# Patient Record
Sex: Female | Born: 1985 | Race: White | Hispanic: No | Marital: Single | Smoking: Current every day smoker
Health system: Southern US, Community
[De-identification: ages and names within clinical notes are randomized; demographics above are authoritative.]

## PROBLEM LIST (undated history)

## (undated) DIAGNOSIS — J984 Other disorders of lung: Secondary | ICD-10-CM

## (undated) DIAGNOSIS — J45909 Unspecified asthma, uncomplicated: Secondary | ICD-10-CM

## (undated) DIAGNOSIS — I319 Disease of pericardium, unspecified: Secondary | ICD-10-CM

## (undated) DIAGNOSIS — I2699 Other pulmonary embolism without acute cor pulmonale: Secondary | ICD-10-CM

## (undated) HISTORY — PX: TONSILLECTOMY: SUR1361

## (undated) HISTORY — DX: Disease of pericardium, unspecified: I31.9

## (undated) HISTORY — PX: OTHER SURGICAL HISTORY: SHX169

---

## 2009-10-28 ENCOUNTER — Ambulatory Visit (HOSPITAL_COMMUNITY): Admission: RE | Admit: 2009-10-28 | Discharge: 2009-10-28 | Payer: Self-pay | Admitting: Obstetrics and Gynecology

## 2013-06-06 ENCOUNTER — Other Ambulatory Visit (HOSPITAL_COMMUNITY)
Admission: RE | Admit: 2013-06-06 | Discharge: 2013-06-06 | Disposition: A | Discharge status: Home / Self Care | Payer: 59 | Source: Ambulatory Visit | Attending: Emergency Medicine | Admitting: Emergency Medicine

## 2013-06-06 ENCOUNTER — Emergency Department (HOSPITAL_COMMUNITY)
Admission: EM | Admit: 2013-06-06 | Discharge: 2013-06-06 | Disposition: A | Discharge status: Hospice | Payer: 59 | Source: Home / Self Care | Attending: Emergency Medicine | Admitting: Emergency Medicine

## 2013-06-06 ENCOUNTER — Emergency Department (HOSPITAL_COMMUNITY): Payer: 59

## 2013-06-06 ENCOUNTER — Encounter (HOSPITAL_COMMUNITY): Payer: Self-pay | Admitting: Emergency Medicine

## 2013-06-06 ENCOUNTER — Emergency Department (HOSPITAL_COMMUNITY)
Admission: EM | Admit: 2013-06-06 | Discharge: 2013-06-06 | Disposition: A | Discharge status: Home / Self Care | Payer: 59 | Attending: Emergency Medicine | Admitting: Emergency Medicine

## 2013-06-06 ENCOUNTER — Emergency Department (INDEPENDENT_AMBULATORY_CARE_PROVIDER_SITE_OTHER): Payer: 59

## 2013-06-06 DIAGNOSIS — Z791 Long term (current) use of non-steroidal anti-inflammatories (NSAID): Secondary | ICD-10-CM | POA: Insufficient documentation

## 2013-06-06 DIAGNOSIS — Z79899 Other long term (current) drug therapy: Secondary | ICD-10-CM | POA: Insufficient documentation

## 2013-06-06 DIAGNOSIS — R109 Unspecified abdominal pain: Secondary | ICD-10-CM | POA: Insufficient documentation

## 2013-06-06 DIAGNOSIS — E860 Dehydration: Secondary | ICD-10-CM

## 2013-06-06 DIAGNOSIS — B9789 Other viral agents as the cause of diseases classified elsewhere: Secondary | ICD-10-CM

## 2013-06-06 DIAGNOSIS — R0789 Other chest pain: Secondary | ICD-10-CM

## 2013-06-06 DIAGNOSIS — J45901 Unspecified asthma with (acute) exacerbation: Secondary | ICD-10-CM | POA: Insufficient documentation

## 2013-06-06 DIAGNOSIS — Z880 Allergy status to penicillin: Secondary | ICD-10-CM | POA: Insufficient documentation

## 2013-06-06 DIAGNOSIS — R071 Chest pain on breathing: Secondary | ICD-10-CM | POA: Insufficient documentation

## 2013-06-06 DIAGNOSIS — Z113 Encounter for screening for infections with a predominantly sexual mode of transmission: Secondary | ICD-10-CM | POA: Insufficient documentation

## 2013-06-06 DIAGNOSIS — Z792 Long term (current) use of antibiotics: Secondary | ICD-10-CM | POA: Insufficient documentation

## 2013-06-06 DIAGNOSIS — Z86711 Personal history of pulmonary embolism: Secondary | ICD-10-CM | POA: Insufficient documentation

## 2013-06-06 DIAGNOSIS — B349 Viral infection, unspecified: Secondary | ICD-10-CM

## 2013-06-06 DIAGNOSIS — N760 Acute vaginitis: Secondary | ICD-10-CM | POA: Insufficient documentation

## 2013-06-06 DIAGNOSIS — Z9089 Acquired absence of other organs: Secondary | ICD-10-CM | POA: Insufficient documentation

## 2013-06-06 HISTORY — DX: Other disorders of lung: J98.4

## 2013-06-06 HISTORY — DX: Other pulmonary embolism without acute cor pulmonale: I26.99

## 2013-06-06 HISTORY — DX: Unspecified asthma, uncomplicated: J45.909

## 2013-06-06 LAB — POCT URINALYSIS DIP (DEVICE)
Bilirubin Urine: NEGATIVE
Glucose, UA: NEGATIVE mg/dL
Ketones, ur: NEGATIVE mg/dL
Leukocytes, UA: NEGATIVE
Nitrite: NEGATIVE
PH: 7 (ref 5.0–8.0)
Protein, ur: NEGATIVE mg/dL
Specific Gravity, Urine: 1.025 (ref 1.005–1.030)
UROBILINOGEN UA: 0.2 mg/dL (ref 0.0–1.0)

## 2013-06-06 LAB — POCT I-STAT, CHEM 8
BUN: 6 mg/dL (ref 6–23)
CREATININE: 0.7 mg/dL (ref 0.50–1.10)
Calcium, Ion: 1.22 mmol/L (ref 1.12–1.23)
Chloride: 104 mEq/L (ref 96–112)
GLUCOSE: 93 mg/dL (ref 70–99)
HEMATOCRIT: 48 % — AB (ref 36.0–46.0)
HEMOGLOBIN: 16.3 g/dL — AB (ref 12.0–15.0)
Potassium: 3.6 mEq/L — ABNORMAL LOW (ref 3.7–5.3)
Sodium: 140 mEq/L (ref 137–147)
TCO2: 24 mmol/L (ref 0–100)

## 2013-06-06 LAB — COMPREHENSIVE METABOLIC PANEL
ALT: 11 U/L (ref 0–35)
AST: 11 U/L (ref 0–37)
Albumin: 4.3 g/dL (ref 3.5–5.2)
Alkaline Phosphatase: 60 U/L (ref 39–117)
BUN: 6 mg/dL (ref 6–23)
CO2: 22 mEq/L (ref 19–32)
CREATININE: 0.65 mg/dL (ref 0.50–1.10)
Calcium: 8.9 mg/dL (ref 8.4–10.5)
Chloride: 101 mEq/L (ref 96–112)
GFR calc non Af Amer: >90 mL/min (ref >90)
Glucose, Bld: 82 mg/dL (ref 70–99)
Potassium: 3.6 mEq/L — ABNORMAL LOW (ref 3.7–5.3)
SODIUM: 139 meq/L (ref 137–147)
TOTAL PROTEIN: 7.5 g/dL (ref 6.0–8.3)
Total Bilirubin: 0.4 mg/dL (ref 0.3–1.2)

## 2013-06-06 LAB — CBC WITH DIFFERENTIAL/PLATELET
Basophils Absolute: 0 10*3/uL (ref 0.0–0.1)
Basophils Absolute: 0 10*3/uL (ref 0.0–0.1)
Basophils Relative: 0 % (ref 0–1)
Basophils Relative: 0 % (ref 0–1)
EOS ABS: 0.1 10*3/uL (ref 0.0–0.7)
EOS ABS: 0.1 10*3/uL (ref 0.0–0.7)
EOS PCT: 1 % (ref 0–5)
Eosinophils Relative: 1 % (ref 0–5)
HCT: 43.2 % (ref 36.0–46.0)
HCT: 40.2 % (ref 36.0–46.0)
Hemoglobin: 15.2 g/dL — ABNORMAL HIGH (ref 12.0–15.0)
Hemoglobin: 13.9 g/dL (ref 12.0–15.0)
Lymphocytes Relative: 44 % (ref 12–46)
Lymphocytes Relative: 42 % (ref 12–46)
Lymphs Abs: 4 10*3/uL (ref 0.7–4.0)
Lymphs Abs: 3.9 10*3/uL (ref 0.7–4.0)
MCH: 30.3 pg (ref 26.0–34.0)
MCH: 29.6 pg (ref 26.0–34.0)
MCHC: 35.2 g/dL (ref 30.0–36.0)
MCHC: 34.6 g/dL (ref 30.0–36.0)
MCV: 86.1 fL (ref 78.0–100.0)
MCV: 85.7 fL (ref 78.0–100.0)
MONO ABS: 0.6 10*3/uL (ref 0.1–1.0)
MONO ABS: 0.6 10*3/uL (ref 0.1–1.0)
Monocytes Relative: 6 % (ref 3–12)
Monocytes Relative: 6 % (ref 3–12)
NEUTROS PCT: 49 % (ref 43–77)
Neutro Abs: 4.6 10*3/uL (ref 1.7–7.7)
Neutro Abs: 4.7 10*3/uL (ref 1.7–7.7)
Neutrophils Relative %: 51 % (ref 43–77)
PLATELETS: 202 10*3/uL (ref 150–400)
Platelets: 215 10*3/uL (ref 150–400)
RBC: 5.02 MIL/uL (ref 3.87–5.11)
RBC: 4.69 MIL/uL (ref 3.87–5.11)
RDW: 12.9 % (ref 11.5–15.5)
RDW: 12.8 % (ref 11.5–15.5)
WBC: 9.3 10*3/uL (ref 4.0–10.5)
WBC: 9.2 10*3/uL (ref 4.0–10.5)

## 2013-06-06 LAB — RAPID STREP SCREEN (MED CTR MEBANE ONLY): Streptococcus, Group A Screen (Direct): NEGATIVE

## 2013-06-06 LAB — TROPONIN I: Troponin I: <0.3 ng/mL (ref <0.30)

## 2013-06-06 LAB — D-DIMER, QUANTITATIVE (NOT AT ARMC)

## 2013-06-06 LAB — POCT PREGNANCY, URINE: Preg Test, Ur: NEGATIVE

## 2013-06-06 LAB — LIPASE, BLOOD: Lipase: 14 U/L (ref 11–59)

## 2013-06-06 MED ORDER — ONDANSETRON HCL 4 MG/2ML IJ SOLN
4.0000 mg | Freq: Once | INTRAMUSCULAR | Status: AC
  Administered 2013-06-06: 4 mg via INTRAVENOUS
  Filled 2013-06-06: qty 2

## 2013-06-06 MED ORDER — SODIUM CHLORIDE 0.9 % IV BOLUS (SEPSIS)
1000.0000 mL | Freq: Once | INTRAVENOUS | Status: AC
  Administered 2013-06-06: 1000 mL via INTRAVENOUS

## 2013-06-06 MED ORDER — HYDROMORPHONE HCL PF 1 MG/ML IJ SOLN
INTRAMUSCULAR | Status: AC
  Filled 2013-06-06: qty 1

## 2013-06-06 MED ORDER — HYDROMORPHONE HCL PF 1 MG/ML IJ SOLN
1.0000 mg | Freq: Once | INTRAMUSCULAR | Status: AC
  Administered 2013-06-06: 1 mg via INTRAVENOUS

## 2013-06-06 MED ORDER — ONDANSETRON HCL 4 MG/2ML IJ SOLN
4.0000 mg | Freq: Once | INTRAMUSCULAR | Status: AC
  Administered 2013-06-06: 4 mg via INTRAVENOUS

## 2013-06-06 MED ORDER — MORPHINE SULFATE 4 MG/ML IJ SOLN
4.0000 mg | Freq: Once | INTRAMUSCULAR | Status: AC
  Administered 2013-06-06: 4 mg via INTRAVENOUS
  Filled 2013-06-06: qty 1

## 2013-06-06 MED ORDER — SODIUM CHLORIDE 0.9 % IV SOLN
INTRAVENOUS | Status: DC
  Administered 2013-06-06: 11:00:00 via INTRAVENOUS

## 2013-06-06 MED ORDER — IBUPROFEN 800 MG PO TABS
800.0000 mg | ORAL_TABLET | Freq: Three times a day (TID) | ORAL | Status: AC
Start: 2013-06-06 — End: ?

## 2013-06-06 MED ORDER — IOHEXOL 350 MG/ML SOLN
100.0000 mL | Freq: Once | INTRAVENOUS | Status: AC | PRN
  Administered 2013-06-06: 100 mL via INTRAVENOUS

## 2013-06-06 MED ORDER — ONDANSETRON HCL 4 MG/2ML IJ SOLN
INTRAMUSCULAR | Status: AC
  Filled 2013-06-06: qty 2

## 2013-06-06 MED ORDER — ONDANSETRON HCL 4 MG/2ML IJ SOLN: 4.0000 mg | Freq: Once | INTRAMUSCULAR | Status: DC

## 2013-06-06 MED ORDER — KETOROLAC TROMETHAMINE 30 MG/ML IJ SOLN
30.0000 mg | Freq: Once | INTRAMUSCULAR | Status: AC
  Administered 2013-06-06: 30 mg via INTRAVENOUS
  Filled 2013-06-06: qty 1

## 2013-06-06 MED ORDER — IOHEXOL 300 MG/ML  SOLN
25.0000 mL | INTRAMUSCULAR | Status: AC
  Administered 2013-06-06: 25 mL via ORAL

## 2013-06-06 MED ORDER — ONDANSETRON HCL 4 MG PO TABS: 4.0000 mg | ORAL_TABLET | Freq: Four times a day (QID) | ORAL | Status: DC

## 2013-06-06 MED ORDER — HYDROMORPHONE HCL PF 1 MG/ML IJ SOLN: 1.0000 mg | Freq: Once | INTRAMUSCULAR | Status: DC

## 2013-06-06 NOTE — ED Notes (Signed)
Md Rancour at bedside.  

## 2013-06-06 NOTE — ED Provider Notes (Signed)
CSN: [id number]     Arrival date & time 06/06/13  1513 History   First MD Initiated Contact with Patient 06/06/13 1545     Chief Complaint  Patient presents with  . Chest Pain  . Emesis     (Consider location/radiation/quality/duration/timing/severity/associated sxs/prior Treatment) HPI Comments: Patient from urgent care with a 5 day history of diarrhea, nausea and vomiting. She also endorses crampy lower abdominal pain with fever up to 100.4. Chills and sweats. She was treated for viral syndrome at urgent care with IV fluids. She had a negative urinalysis, negative pregnancy test. Electrolytes were normal. Chest x-ray was negative. D-dimer was negative. Patient developed central chest pain around 1 AM it has been constant and associated with deep breathing. 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 She states this pain feels different. She denies contacts. She has been on azithromycin for the past 4 days which was started prior to upper respiratory infection. This illness started before she was on azithromycin. She is given IV fluids and sent to the ED when she was still having chest pain is still orthostatic.  The history is provided by the patient.    Past Medical History  Diagnosis Date  . Lung disease, restrictive   . PE (pulmonary embolism)   . Asthma    Past Surgical History  Procedure Laterality Date  . Tonsillectomy     Family History  Problem Relation Age of Onset  . COPD Father    History  Substance Use Topics  . Smoking status: Not on file  . Smokeless tobacco: Not on file  . Alcohol Use: Not on file   OB History   Grav Para Term Preterm Abortions TAB SAB Ect Mult Living                 Review of Systems  Constitutional: Positive for fever, activity change, appetite change and fatigue.  HENT: Positive for sore throat. Negative for congestion.   Respiratory: Positive for chest  tightness and shortness of breath. Negative for cough.   Cardiovascular: Positive for chest pain.  Gastrointestinal: Positive for nausea, vomiting, abdominal pain and diarrhea.  Genitourinary: Negative for dysuria and hematuria.  Musculoskeletal: Positive for arthralgias and myalgias.  Neurological: Positive for weakness.  A complete 10 system review of systems was obtained and all systems are negative except as noted in the HPI and PMH.      Allergies  Penicillins and Sulfa antibiotics  Home Medications   Current Outpatient Rx  Name  Route  Sig  Dispense  Refill  . acetaminophen (TYLENOL) 500 MG tablet   Oral   Take 1,000 mg by mouth every 8 (eight) hours as needed for fever.         . Aromatic Inhalants (DECONGESTANT INHALER IN)   Inhalation   Inhale 1 puff into the lungs 5 (five) times daily as needed (allergies).         Marland Kitchen azithromycin (ZITHROMAX) 250 MG tablet   Oral   Take 250 mg by mouth daily.         Marland Kitchen LORazepam (ATIVAN) 1 MG tablet   Oral   Take 1 mg by mouth every 8 (eight) hours as needed for anxiety.         Marland Kitchen PAROXETINE HCL PO   Oral   Take 1 tablet by mouth daily.         Marland Kitchen ibuprofen (ADVIL,MOTRIN) 800  MG tablet   Oral   Take 1 tablet (800 mg total) by mouth 3 (three) times daily.   21 tablet   0   . ondansetron (ZOFRAN) 4 MG tablet   Oral   Take 1 tablet (4 mg total) by mouth every 6 (six) hours.   12 tablet   0    BP 108/55  Pulse 76  Temp(Src) 98.1 F (36.7 C) (Oral)  Resp 14  SpO2 100%  LMP 04/08/2013 Physical Exam  Constitutional: She is oriented to person, place, and time. She appears well-developed and well-nourished. No distress.  HENT:  Head: Normocephalic and atraumatic.  Mouth/Throat: Oropharynx is clear and moist. No oropharyngeal exudate.  Eyes: Conjunctivae and EOM are normal. Pupils are equal, round, and reactive to light.  Neck: Normal range of motion. Neck supple.  No meningismus  Cardiovascular: Normal rate,  regular rhythm and normal heart sounds.   No murmur heard. Pulmonary/Chest: Effort normal and breath sounds normal. No respiratory distress. She exhibits tenderness.  Chest wall deformity  Abdominal: Soft. There is no tenderness. There is no rebound and no guarding.  Soft, NTND  Musculoskeletal: Normal range of motion. She exhibits no edema and no tenderness.  Neurological: She is alert and oriented to person, place, and time. No cranial nerve deficit. She exhibits normal muscle tone. Coordination normal.  Skin: Skin is warm. No rash noted.    ED Course  Procedures (including critical care time) Labs Review Labs Reviewed  COMPREHENSIVE METABOLIC PANEL - Abnormal; Notable for the following:    Potassium 3.6 (*)    All other components within normal limits  RAPID STREP SCREEN  CULTURE, GROUP A STREP  CBC WITH DIFFERENTIAL  LIPASE, BLOOD  TROPONIN I   Imaging Review Dg Chest 2 View  06/06/2013   CLINICAL DATA:  Nausea, cough, vomiting  EXAM: CHEST  2 VIEW  COMPARISON:  11/04/2012  FINDINGS: Cardiomediastinal silhouette is stable. No acute infiltrate or pleural effusion. No pulmonary edema. Bony thorax is unremarkable.  IMPRESSION: No active cardiopulmonary disease.   Electronically Signed   By: Natasha MeadLiviu  Pop M.D.   On: 06/06/2013 11:46   Ct Angio Chest Pe W/cm &/or Wo Cm  06/06/2013   CLINICAL DATA:  Chest pain and shortness of Breath.  Nausea and vomiting.  EXAM: CT ANGIOGRAPHY CHEST  CT ABDOMEN AND PELVIS WITH CONTRAST  TECHNIQUE: Multidetector CT imaging of the chest was performed using the standard protocol during bolus administration of intravenous contrast. Multiplanar CT image reconstructions and MIPs were obtained to evaluate the vascular anatomy. Multidetector CT imaging of the abdomen and pelvis was performed using the standard protocol during bolus administration of intravenous contrast.  CONTRAST:  100mL OMNIPAQUE IOHEXOL 350 MG/ML SOLN  COMPARISON:  10/14/2011  FINDINGS: CTA CHEST  FINDINGS  The chest wall is unremarkable. No breast masses, supraclavicular or axillary adenopathy. The bony thorax is intact. Pectus carinatum noted. The thyroid gland appears normal.  The heart is normal in size. No pericardial effusion. No mediastinal or hilar mass or adenopathy. The aorta is normal in caliber. No dissection. The esophagus is grossly normal.  The pulmonary arterial tree is well opacified. No filling defects to suggest pulmonary emboli.  The lungs are clear.  No pleural effusion or pulmonary nodule.  CT ABDOMEN and PELVIS FINDINGS  The liver is unremarkable. No focal hepatic lesions or biliary dilatation. The gallbladder is normal. No common bowel duct dilatation. The pancreas is normal. The spleen is normal. The adrenal glands and kidneys  are normal.  The stomach, duodenum, small bowel and colon are unremarkable. No inflammatory changes, mass lesions or obstructive findings. The appendix is normal. No mesenteric or retroperitoneal mass or adenopathy. The aorta and branch vessels are normal. The major venous structures are patent.  The uterus and ovaries are unremarkable. Small cyst associated with both ovaries. No pelvic mass, adenopathy or significant free pelvic fluid collection. No inguinal mass or adenopathy.  The bony structures are unremarkable.  Review of the MIP images confirms the above findings.  IMPRESSION: 1. No CT findings for pulmonary embolism. 2. Normal thoracic aorta. 3. No acute pulmonary findings. 4. No acute abdominal/pelvic findings, mass lesions or adenopathy.   Electronically Signed   By: Loralie Champagne M.D.   On: 06/06/2013 18:13   Ct Abdomen Pelvis W Contrast  06/06/2013   CLINICAL DATA:  Chest pain and shortness of Breath.  Nausea and vomiting.  EXAM: CT ANGIOGRAPHY CHEST  CT ABDOMEN AND PELVIS WITH CONTRAST  TECHNIQUE: Multidetector CT imaging of the chest was performed using the standard protocol during bolus administration of intravenous contrast. Multiplanar CT  image reconstructions and MIPs were obtained to evaluate the vascular anatomy. Multidetector CT imaging of the abdomen and pelvis was performed using the standard protocol during bolus administration of intravenous contrast.  CONTRAST:  OMNIPAQUE IOHEXOL 350 MG/ML SOLN  COMPARISON:  10/14/2011  FINDINGS: CTA CHEST FINDINGS  The chest wall is unremarkable. No breast masses, supraclavicular or axillary adenopathy. The bony thorax is intact. Pectus carinatum noted. The thyroid gland appears normal.  The heart is normal in size. No pericardial effusion. No mediastinal or hilar mass or adenopathy. The aorta is normal in caliber. No dissection. The esophagus is grossly normal.  The pulmonary arterial tree is well opacified. No filling defects to suggest pulmonary emboli.  The lungs are clear.  No pleural effusion or pulmonary nodule.  CT ABDOMEN and PELVIS FINDINGS  The liver is unremarkable. No focal hepatic lesions or biliary dilatation. The gallbladder is normal. No common bowel duct dilatation. The pancreas is normal. The spleen is normal. The adrenal glands and kidneys are normal.  The stomach, duodenum, small bowel and colon are unremarkable. No inflammatory changes, mass lesions or obstructive findings. The appendix is normal. No mesenteric or retroperitoneal mass or adenopathy. The aorta and branch vessels are normal. The major venous structures are patent.  The uterus and ovaries are unremarkable. Small cyst associated with both ovaries. No pelvic mass, adenopathy or significant free pelvic fluid collection. No inguinal mass or adenopathy.  The bony structures are unremarkable.  Review of the MIP images confirms the above findings.  IMPRESSION: 1. No CT findings for pulmonary embolism. 2. Normal thoracic aorta. 3. No acute pulmonary findings. 4. No acute abdominal/pelvic findings, mass lesions or adenopathy.   Electronically Signed   By: Loralie Champagne M.D.   On: 06/06/2013 18:13     EKG  Interpretation   Date/Time:  Thursday June 06 2013 15:24:31 EDT Ventricular Rate:  78 PR Interval:  117 QRS Duration: 89 QT Interval:  390 QTC Calculation: 444 R Axis:   66 Text Interpretation:  Sinus or ectopic atrial rhythm Borderline short PR  interval Borderline T abnormalities, inferior leads No significant change  was found Confirmed by Manus Gunning  MD, Ebonie Westerlund [phone]) on 06/06/2013 3:46:02 PM      MDM   Final diagnoses:  Viral syndrome  Dehydration  Chest wall pain   Patient from urgent care with 5 day history of diarrhea, vomiting,  body aches, cramps, cough and chest pain has been constant since 1 AM. Her chest is sore to palpation. EKG shows normal sinus rhythm. History of PE and pericarditis.  UA negative at urgent care. Pregnancy test negative. Chest x-ray negative. Patient is in no distress. She is reproducible chest tenderness. Her d-dimer was negative at urgent care.  Labs remarkable for decreased hemoglobin for likely dilutional due to all the fluid she received today.  CT is negative for PE. No intra-abdominal pathology. Patient given IV fluids in the ED. She's not had any vomiting and she is tolerating by mouth. She is able to ambulate.  Suspect viral syndrome causing nausea, vomiting and diarrhea. No peritoneal signs. Chest wall is tender and reproducible to palpation. No evidence of MI, PE, pneumonia or pneumothorax. We'll treat with supportive fluids at home, Zofran, anti-inflammatories for chest wall pain. BP 108/55  Pulse 76  Temp(Src) 98.1 F (36.7 C) (Oral)  Resp 14  SpO2 100%  LMP 04/08/2013   Glynn Octave, MD 06/07/13 0000

## 2013-06-06 NOTE — ED Notes (Signed)
Patient transported to X-ray 

## 2013-06-06 NOTE — Discharge Instructions (Signed)
We have determined that your problem requires further evaluation in the emergency department.  We will take care of your transport there.  Once at the emergency department, you will be evaluated by a provider and they will order whatever treatment or tests they deem necessary.  We cannot guarantee that they will do any specific test or do any specific treatment.  ° °

## 2013-06-06 NOTE — ED Provider Notes (Signed)
Chief Complaint   Chief Complaint  Patient presents with  . Cough  . Emesis  . Diarrhea     History of Present Illness   Donna Knight is a 28 year old female who has had a five-day history that began with diarrhea with small streaks of bright red blood, nausea, and vomiting. She had some crampy lower abdominal pain, cough productive of light green sputum, chest pain which is worse with deep inspiration, and chest pressure. She's had trouble breathing and her chest feels tight. She feels weak and dizzy. She's had a low-grade temperature up to 100.4, chills, and sweats she also has a history of sore throat, rhinorrhea, headache, and neck stiffness. She also mentions some vaginal discharge, itching, and vaginal dryness. She has a history of 3 pulmonary emboli, but this happened while she was on birth control pills. She also has a history of 2 episodes of pericarditis.  Review of Systems   Other than as noted above, the patient denies any of the following symptoms: Systemic:  No fevers, chills, or dizziness. ENT:  No nasal congestion, rhinorrhea, or sore throat. Lungs:  No cough. GI:  Blood in stool or vomitus. GU:  No dysuria, frequency, or urgency.  PMFSH   Past medical history, family history, social history, meds, and allergies were reviewed.  She is allergic to sulfa and penicillin. She takes Ativan approximately. She was placed on azithromycin earlier in the week by her primary care physician, but she does not feel like she's getting any better, in fact she feels like she's getting worse.  Physical Exam     Vital signs:  BP 113/77  Pulse 102  Temp(Src) 98.6 F (37 C) (Oral)  Resp 18  SpO2 100% Filed Vitals:   06/06/13 1053 06/06/13 1155 Supine  06/06/13 1157 Sitting   BP: 130/84 123/71 113/77  Pulse: 82 88 102  Temp: 98.6 F (37 C)    TempSrc: Oral    Resp: 18    SpO2: 100%     General:  Alert and oriented.  In no distress.  Skin warm and dry.  Good skin turgor,  brisk capillary refill. ENT:  No scleral icterus, moist mucous membranes, no oral lesions, pharynx clear. Lungs:  Breath sounds clear and equal bilaterally.  No wheezes, rales, or rhonchi. Heart:  Rhythm regular, without extrasystoles.  No gallops or murmers. Abdomen:  Soft, flat, nondistended. No organomegaly or mass. Bowel sounds are hyperactive. She does have generalized, mild tenderness to palpation without guarding or rebound. Pelvic exam: Normal external genitalia. Vaginal and cervical mucosa were normal. There was no vaginal discharge or bleeding. No pain on cervical motion. Uterus was normal in size and shape and nontender. No adnexal tenderness or mass. Skin: Clear, warm, and dry.  Good turgor.  Brisk capillary refill.  Labs   Results for orders placed during the hospital encounter of 06/06/13  CBC WITH DIFFERENTIAL      Result Value Ref Range   WBC 9.3  4.0 - 10.5 K/uL   RBC 5.02  3.87 - 5.11 MIL/uL   Hemoglobin 15.2 (*) 12.0 - 15.0 g/dL   HCT 57.8  46.9 - 62.9 %   MCV 86.1  78.0 - 100.0 fL   MCH 30.3  26.0 - 34.0 pg   MCHC 35.2  30.0 - 36.0 g/dL   RDW 52.8  41.3 - 24.4 %   Platelets 215  150 - 400 K/uL   Neutrophils Relative % 49  43 - 77 %  Neutro Abs 4.6  1.7 - 7.7 K/uL   Lymphocytes Relative 44  12 - 46 %   Lymphs Abs 4.0  0.7 - 4.0 K/uL   Monocytes Relative 6  3 - 12 %   Monocytes Absolute 0.6  0.1 - 1.0 K/uL   Eosinophils Relative 1  0 - 5 %   Eosinophils Absolute 0.1  0.0 - 0.7 K/uL   Basophils Relative 0  0 - 1 %   Basophils Absolute 0.0  0.0 - 0.1 K/uL  D-DIMER, QUANTITATIVE      Result Value Ref Range   D-Dimer, Quant <0.27  0.00 - 0.48 ug/mL-FEU  POCT URINALYSIS DIP (DEVICE)      Result Value Ref Range   Glucose, UA NEGATIVE  NEGATIVE mg/dL   Bilirubin Urine NEGATIVE  NEGATIVE   Ketones, ur NEGATIVE  NEGATIVE mg/dL   Specific Gravity, Urine 1.025  1.005 - 1.030   Hgb urine dipstick TRACE (*) NEGATIVE   pH 7.0  5.0 - 8.0   Protein, ur NEGATIVE  NEGATIVE  mg/dL   Urobilinogen, UA 0.2  0.0 - 1.0 mg/dL   Nitrite NEGATIVE  NEGATIVE   Leukocytes, UA NEGATIVE  NEGATIVE  POCT PREGNANCY, URINE      Result Value Ref Range   Preg Test, Ur NEGATIVE  NEGATIVE  POCT I-STAT, CHEM 8      Result Value Ref Range   Sodium 140  137 - 147 mEq/L   Potassium 3.6 (*) 3.7 - 5.3 mEq/L   Chloride 104  96 - 112 mEq/L   BUN 6  6 - 23 mg/dL   Creatinine, Ser 2.45  0.50 - 1.10 mg/dL   Glucose, Bld 93  70 - 99 mg/dL   Calcium, Ion 8.09  9.83 - 1.23 mmol/L   TCO2 24  0 - 100 mmol/L   Hemoglobin 16.3 (*) 12.0 - 15.0 g/dL   HCT 38.2 (*) 50.5 - 39.7 %     Radiology   Dg Chest 2 View  06/06/2013   CLINICAL DATA:  Nausea, cough, vomiting  EXAM: CHEST  2 VIEW  COMPARISON:  11/04/2012  FINDINGS: Cardiomediastinal silhouette is stable. No acute infiltrate or pleural effusion. No pulmonary edema. Bony thorax is unremarkable.  IMPRESSION: No active cardiopulmonary disease.   Electronically Signed   By: Natasha Mead M.D.   On: 06/06/2013 11:46    I reviewed the images independently and personally and concur with the radiologist's findings.   EKG Results:  Date: 06/06/2013  Rate: 80  Rhythm: normal sinus rhythm  QRS Axis: normal  Intervals: PR prolonged  ST/T Wave abnormalities: normal  Conduction Disutrbances:none  Narrative Interpretation: Sinus rhythm with short PR interval  Old EKG Reviewed: none available   Course in Urgent Care Center   She was begun on IV normal saline. We'll plan to give her a liter, but the IV was very positional and we are only able to get in and half later. She was given Zofran 4 mg intravenously and Dilaudid a total of 2 mg intravenously. Following these treatments, she did not feel any better, in fact she stated she felt worse. She attempted to get off the exam table to pick up an emesis bag that she had dropped and was not able to get back up on the table. She was found sitting on the floor the exam room. The patient states she did not pass  out and did not hit her head or sustain any other injury.   Assessment  The primary encounter diagnosis was Viral syndrome. A diagnosis of Dehydration was also pertinent to this visit.  Plan   The patient was transferred to the ED via [facility] in stable condition.  Medical Decision Making:  28 year old female with history of pericarditis and PE times 3 has a 5 day history of diarrhea with small amounts of blood, vomiting of all PO intake, crampy abdominal pain, cough productive of yellow sputum, pleuritic chest pain, chest pressure, difficulty breathing, weakness, dizziness, low grade fever, chills, sweats, sore throat, rhinorrhea, neck stiffness, and headache.  Her exam is WNL.  She has had extensive workup here including CXR, EKG, CBC, iStat 8, d-dimer, UA, preg test, all of which have been negative.  Her blood pressures showed orthostatic drop.  She has been given 1 liter of IV fluid, Zofran IV, and Dilaudid 2 mg IV but does not feel any better, in fact, she feels worse.  My impression is viral syndrome with dehydration and I think she will need more extensive fluid replacement.           Reuben Likesavid C Krishauna Schatzman, MD 06/06/13 [phone]

## 2013-06-06 NOTE — ED Notes (Signed)
C/o diarrhea, cough which is making chest hurt, and vomiting States she hash sob and chest pain due to coughing Did see PCP on Tuesday and received medication but no relief.  Feels as if sx is worst Tylenol was taking today for fever pcp was called today and told patient that a cough syrup could be called into pharmacy

## 2013-06-06 NOTE — ED Notes (Signed)
PT [facility] with multiple complaints including diarrhea with small amts of blood, Vomitting, lower "crampy" abd pain, productive cough, CP/pressure, SOB, weakness,dizziness, low grade fever,, soret throat, rhinorrhea, neck stiffness and HA. Pt is AO x4. Neuro intact. NAD. Vitals stable on monitor.

## 2013-06-06 NOTE — ED Notes (Signed)
[facility] notified 

## 2013-06-06 NOTE — ED Notes (Signed)
[facility] with [facility]/V, productive cough, SOB since Sunday. [facility] central CP relieved with 2mg  Dialudid.130/82. 88 bpm. 18 RR. 100% RA. Hx: PE, pericarditis

## 2013-06-06 NOTE — Discharge Instructions (Signed)
Chest Wall Pain There is no evidence of heart attack or blood clot in the lung. Keep yourself hydrated. Follow up with your doctor. Return to the ED if you develop new or worsening symptoms. Chest wall pain is pain in or around the bones and muscles of your chest. It may take up to 6 weeks to get better. It may take longer if you must stay physically active in your work and activities.  CAUSES  Chest wall pain may happen on its own. However, it may be caused by:  A viral illness like the flu.  Injury.  Coughing.  Exercise.  Arthritis.  Fibromyalgia.  Shingles. HOME CARE INSTRUCTIONS   Avoid overtiring physical activity. Try not to strain or perform activities that cause pain. This includes any activities using your chest or your abdominal and side muscles, especially if heavy weights are used.  Put ice on the sore area.  Put ice in a plastic bag.  Place a towel between your skin and the bag.  Leave the ice on for 15-20 minutes per hour while awake for the first 2 days.  Only take over-the-counter or prescription medicines for pain, discomfort, or fever as directed by your caregiver. SEEK IMMEDIATE MEDICAL CARE IF:   Your pain increases, or you are very uncomfortable.  You have a fever.  Your chest pain becomes worse.  You have new, unexplained symptoms.  You have nausea or vomiting.  You feel sweaty or lightheaded.  You have a cough with phlegm (sputum), or you cough up blood. MAKE SURE YOU:   Understand these instructions.  Will watch your condition.  Will get help right away if you are not doing well or get worse. Document Released: 02/14/2005 Document Revised: 05/09/2011 Document Reviewed: 10/11/2010 [facility] Patient Information 2014 [employer] [place].  Dehydration, Adult Dehydration is when you lose more fluids from the body than you take in. Vital organs like the kidneys, brain, and heart cannot function without a proper amount of fluids and salt. Any loss  of fluids from the body can cause dehydration.  CAUSES   Vomiting.  Diarrhea.  Excessive sweating.  Excessive urine output.  Fever. SYMPTOMS  Mild dehydration  Thirst.  Dry lips.  Slightly dry mouth. Moderate dehydration  Very dry mouth.  Sunken eyes.  Skin does not bounce back quickly when lightly pinched and released.  Dark urine and decreased urine production.  Decreased tear production.  Headache. Severe dehydration  Very dry mouth.  Extreme thirst.  Rapid, weak pulse (more than 100 beats per minute at rest).  Cold hands and feet.  Not able to sweat in spite of heat and temperature.  Rapid breathing.  Blue lips.  Confusion and lethargy.  Difficulty being awakened.  Minimal urine production.  No tears. DIAGNOSIS  Your caregiver will diagnose dehydration based on your symptoms and your exam. Blood and urine tests will help confirm the diagnosis. The diagnostic evaluation should also identify the cause of dehydration. TREATMENT  Treatment of mild or moderate dehydration can often be done at home by increasing the amount of fluids that you drink. It is best to drink small amounts of fluid more often. Drinking too much at one time can make vomiting worse. Refer to the home care instructions below. Severe dehydration needs to be treated at the hospital where you will probably be given intravenous (IV) fluids that contain water and electrolytes. HOME CARE INSTRUCTIONS   Ask your caregiver about specific rehydration instructions.  Drink enough fluids to keep your  urine clear or pale yellow.  Drink small amounts frequently if you have nausea and vomiting.  Eat as you normally do.  Avoid:  Foods or drinks high in sugar.  Carbonated drinks.  Juice.  Extremely hot or cold fluids.  Drinks with caffeine.  Fatty, greasy foods.  Alcohol.  Tobacco.  Overeating.  Gelatin desserts.  Wash your hands well to avoid spreading bacteria and  viruses.  Only take over-the-counter or prescription medicines for pain, discomfort, or fever as directed by your caregiver.  Ask your caregiver if you should continue all prescribed and over-the-counter medicines.  Keep all follow-up appointments with your caregiver. SEEK MEDICAL CARE IF:  You have abdominal pain and it increases or stays in one area (localizes).  You have a rash, stiff neck, or severe headache.  You are irritable, sleepy, or difficult to awaken.  You are weak, dizzy, or extremely thirsty. SEEK IMMEDIATE MEDICAL CARE IF:   You are unable to keep fluids down or you get worse despite treatment.  You have frequent episodes of vomiting or diarrhea.  You have blood or green matter (bile) in your vomit.  You have blood in your stool or your stool looks black and tarry.  You have not urinated in 6 to 8 hours, or you have only urinated a small amount of very dark urine.  You have a fever.  You faint. MAKE SURE YOU:   Understand these instructions.  Will watch your condition.  Will get help right away if you are not doing well or get worse. Document Released: 02/14/2005 Document Revised: 05/09/2011 Document Reviewed: 10/04/2010 [facility] Patient Information 2014 [place], [place].

## 2013-06-06 NOTE — ED Notes (Signed)
PT was able to drink all fluid for CT with no emesis. Pt ambulates independently with no noted difficituly, denies dizziness or lightheadedness.

## 2013-06-07 LAB — CERVICOVAGINAL ANCILLARY ONLY
CHLAMYDIA, DNA PROBE: NEGATIVE
Neisseria Gonorrhea: NEGATIVE
WET PREP (BD AFFIRM): NEGATIVE
Wet Prep (BD Affirm): NEGATIVE
Wet Prep (BD Affirm): POSITIVE — AB

## 2013-06-08 LAB — CULTURE, GROUP A STREP

## 2013-06-10 ENCOUNTER — Telehealth (HOSPITAL_COMMUNITY): Payer: Self-pay | Admitting: Emergency Medicine

## 2013-06-10 MED ORDER — METRONIDAZOLE 500 MG PO TABS: 500.0000 mg | ORAL_TABLET | Freq: Two times a day (BID) | ORAL | Status: DC

## 2013-06-10 NOTE — Telephone Encounter (Signed)
Message copied by Reuben LikesKELLER, Indiah Heyden C on Mon Jun 10, 2013  4:33 PM ------      Message from: Vassie MoselleYORK, SUZANNE M      Created: Mon Jun 10, 2013  4:04 PM      Regarding: lab       Gardnerella pos. Rest of labs neg.  Pt. was transferred to ED, but I don't see that they treated this.      Desiree LucySuzanne M York      06/10/2013       ------

## 2013-06-10 NOTE — ED Notes (Signed)
The patient's DNA probe came back positive for Gardnerella. She will need metronidazole 500 mg, #14, 1 twice a day for one week. This will be sent to her pharmacy. We will need to call her and let her know these results.   Reuben Likesavid C Maveryck Bahri, MD 06/10/13 [phone]

## 2013-06-10 NOTE — ED Notes (Signed)
GC/Chlamydia neg., Affirm: Candida and Trich neg., Gardnerella pos.  Message sent to Dr. Lorenz CoasterKeller. Desiree LucySuzanne M [facility] 06/10/2013

## 2013-06-11 ENCOUNTER — Telehealth (HOSPITAL_COMMUNITY): Payer: Self-pay | Admitting: *Deleted

## 2013-06-11 NOTE — ED Notes (Signed)
I called pt. Pt. verified x 2 and given results.  Pt. told she needs Flagyl for bacterial vaginosis and where to pick up her Rx.  Pt. instructed to no alcohol while taking this medication.  Pt.'s questions about bacterial vaginosis answered. Desiree LucySuzanne M [facility] 06/11/2013

## 2014-08-22 IMAGING — CT CT ABD-PELV W/ CM
3 of 14 series · 12 of 46 positions shown, 18 images · IV contrast (omnipaque)
Comparison: 10/14/2011

CLINICAL DATA: Chest pain and shortness of Breath.

Nausea and vomiting.
EXAM:
CT ANGIOGRAPHY CHEST
CT ABDOMEN AND PELVIS WITH CONTRAST
TECHNIQUE: Multidetector CT imaging of the chest was performed using the
standard protocol during bolus administration of intravenous
contrast. Multiplanar CT image reconstructions and MIPs were
obtained to evaluate the vascular anatomy. Multidetector CT imaging
of the abdomen and pelvis was performed using the standard protocol
during bolus administration of intravenous contrast.
CONTRAST:  100mL OMNIPAQUE IOHEXOL 350 MG/ML SOLN

[Series 6: thins · axial · 0.59mm/px · z∈[-121,-25]mm · 5 of 193 slices shown]
[im 17/193  soft-tissue]
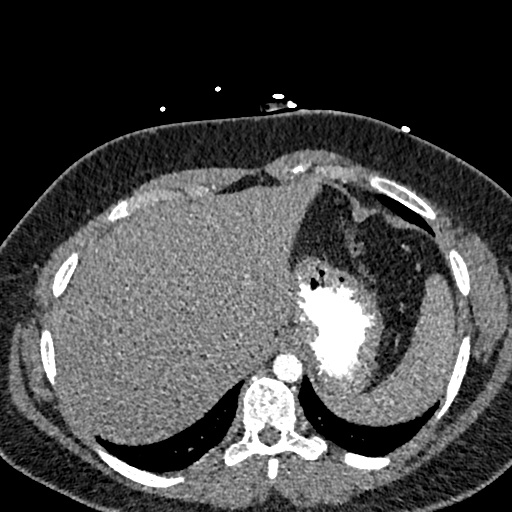
[im 49/193  soft-tissue]
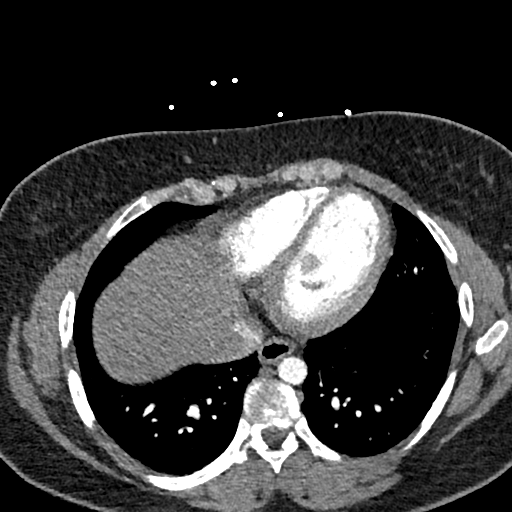
[im 65/193  soft-tissue]
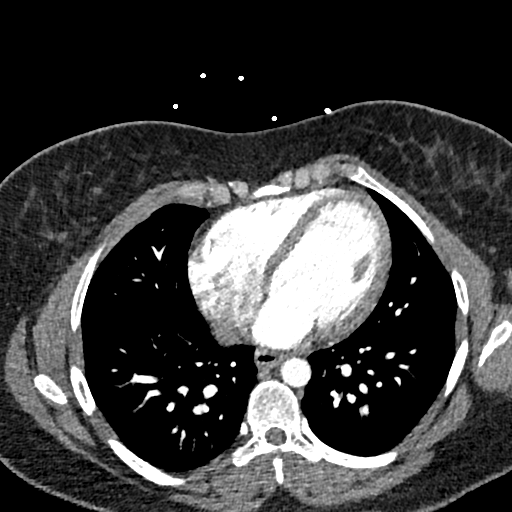
[im 81/193  soft-tissue]
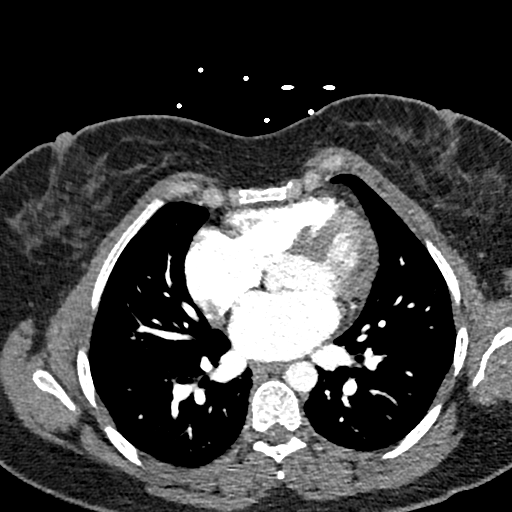
[im 113/193  soft-tissue]
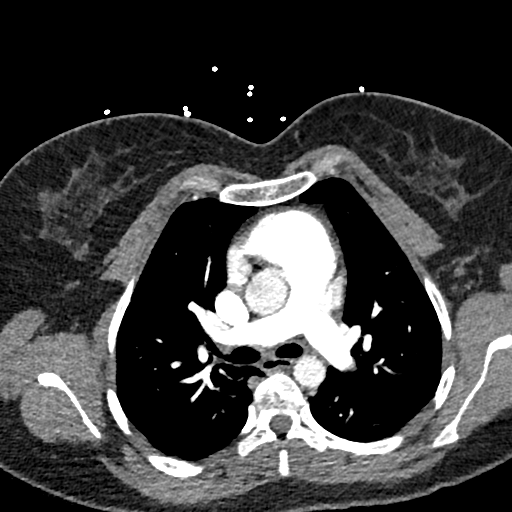

[Series 8: coronal mpr · coronal · 0.59mm/px · 2 of 109 slices shown, 3 images]
[im 37/109  soft-tissue]
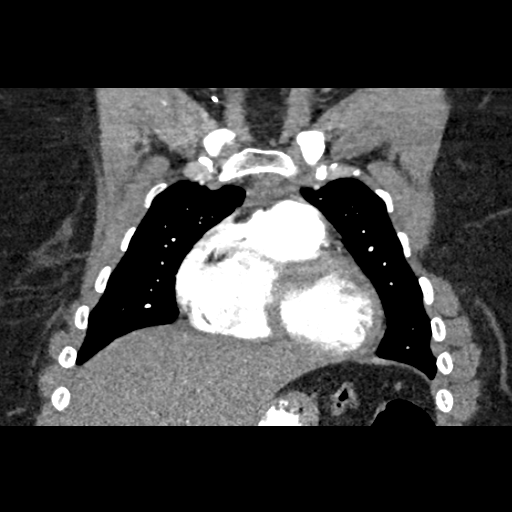
[im 37/109  bone]
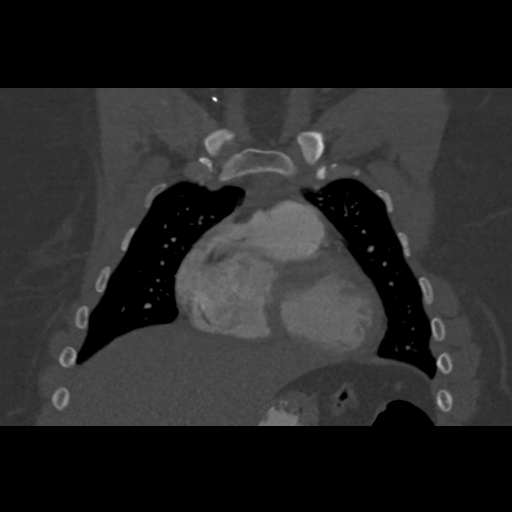
[im 73/109  soft-tissue]
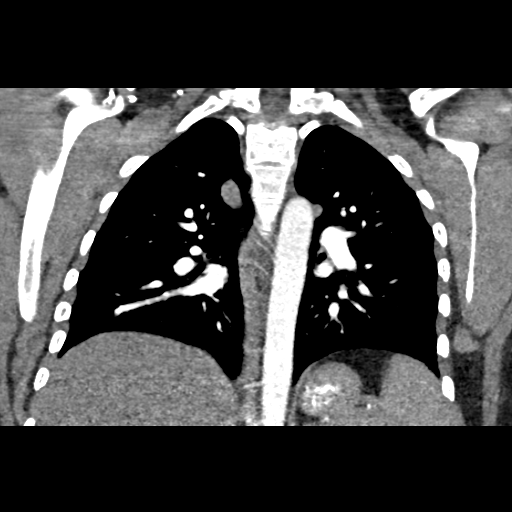

[Series 12: abd/ pelvis 5.0 i30f 1 · axial · 0.74mm/px · z∈[-466,-141]mm · 5 of 99 slices shown, 10 images]
[im 17/99  soft-tissue]
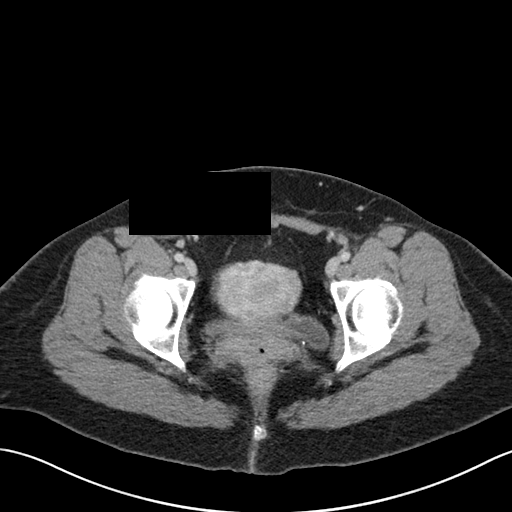
[im 17/99  bone]
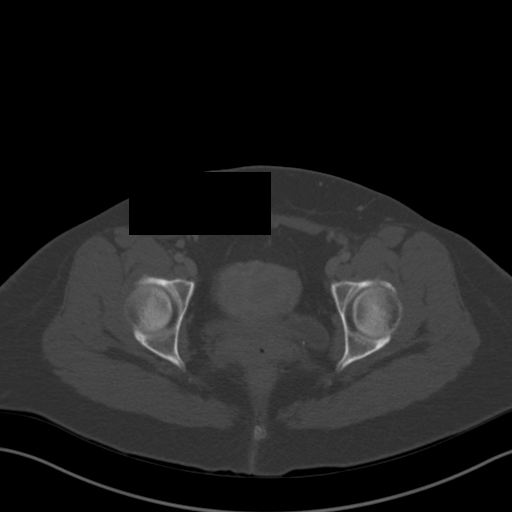
[im 33/99  soft-tissue]
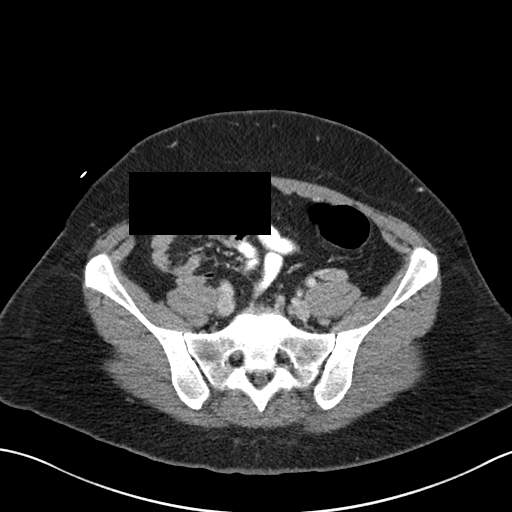
[im 33/99  lung]
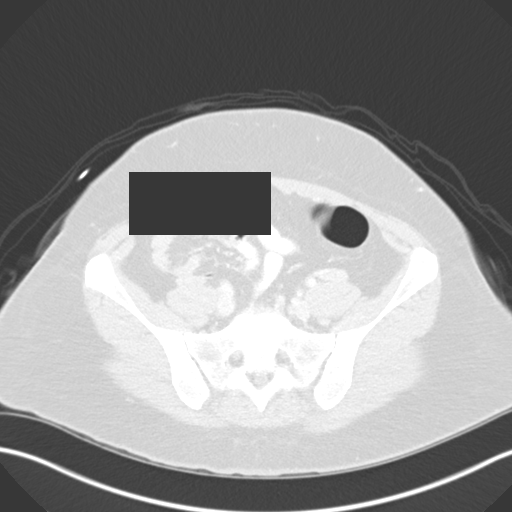
[im 50/99  soft-tissue]
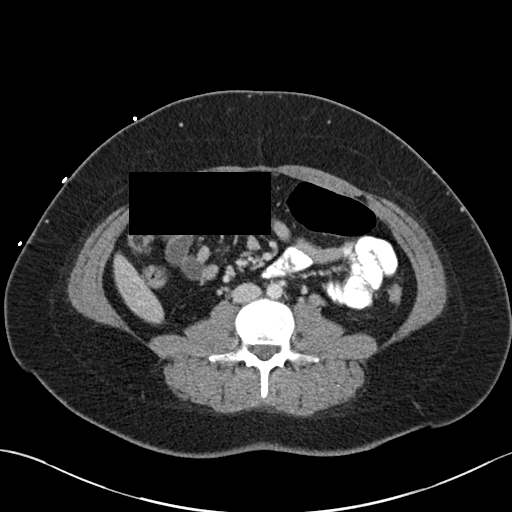
[im 50/99  lung]
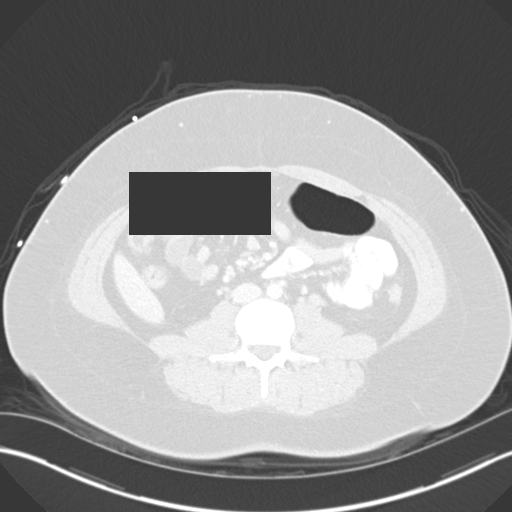
[im 66/99  soft-tissue]
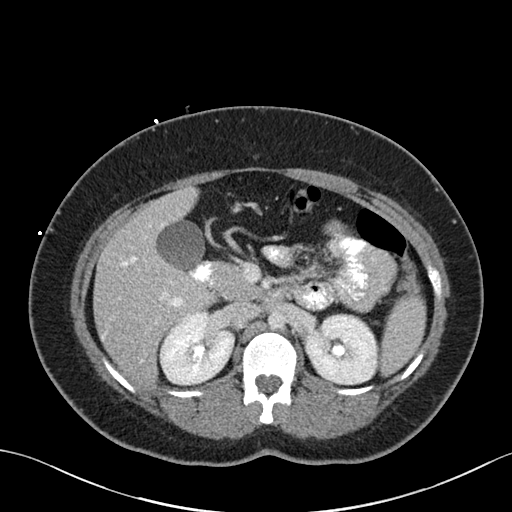
[im 66/99  lung]
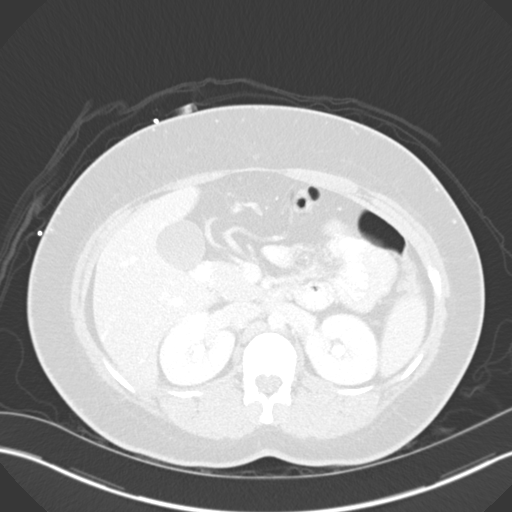
[im 82/99  soft-tissue]
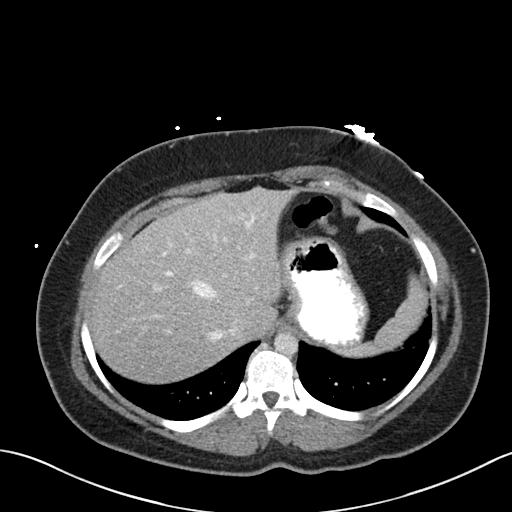
[im 82/99  lung]
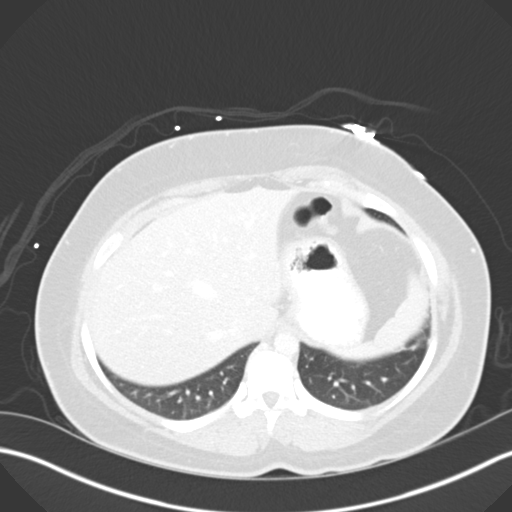

[12 of 46 positions shown; findings below may reference images not displayed]

FINDINGS: CTA CHEST FINDINGS

The chest wall is unremarkable. No breast masses, supraclavicular or
axillary adenopathy. The bony thorax is intact. Pectus carinatum
noted. The thyroid gland appears normal.

The heart is normal in size. No pericardial effusion. No mediastinal
or hilar mass or adenopathy. The aorta is normal in caliber. No
dissection. The esophagus is grossly normal.

The pulmonary arterial tree is well opacified. No filling defects to
suggest pulmonary emboli.

The lungs are clear.  No pleural effusion or pulmonary nodule.

CT ABDOMEN and PELVIS FINDINGS

The liver is unremarkable. No focal hepatic lesions or biliary
dilatation. The gallbladder is normal. No common bowel duct
dilatation. The pancreas is normal. The spleen is normal. The
adrenal glands and kidneys are normal.

The stomach, duodenum, small bowel and colon are unremarkable. No
inflammatory changes, mass lesions or obstructive findings. The
appendix is normal. No mesenteric or retroperitoneal mass or
adenopathy. The aorta and branch vessels are normal. The major
venous structures are patent.

The uterus and ovaries are unremarkable. Small cyst associated with
both ovaries. No pelvic mass, adenopathy or significant free pelvic
fluid collection. No inguinal mass or adenopathy.

The bony structures are unremarkable.

Review of the MIP images confirms the above findings.
IMPRESSION: 1. No CT findings for pulmonary embolism.
2. Normal thoracic aorta.
3. No acute pulmonary findings.
4. No acute abdominal/pelvic findings, mass lesions or adenopathy.

## 2014-08-22 IMAGING — CR DG CHEST 2V
2 series · 2 of 2 positions shown · non-contrast
Comparison: 11/04/2012

CLINICAL DATA: Nausea, cough, vomiting

EXAM:
CHEST  2 VIEW

[view not recorded (1 of 2)]
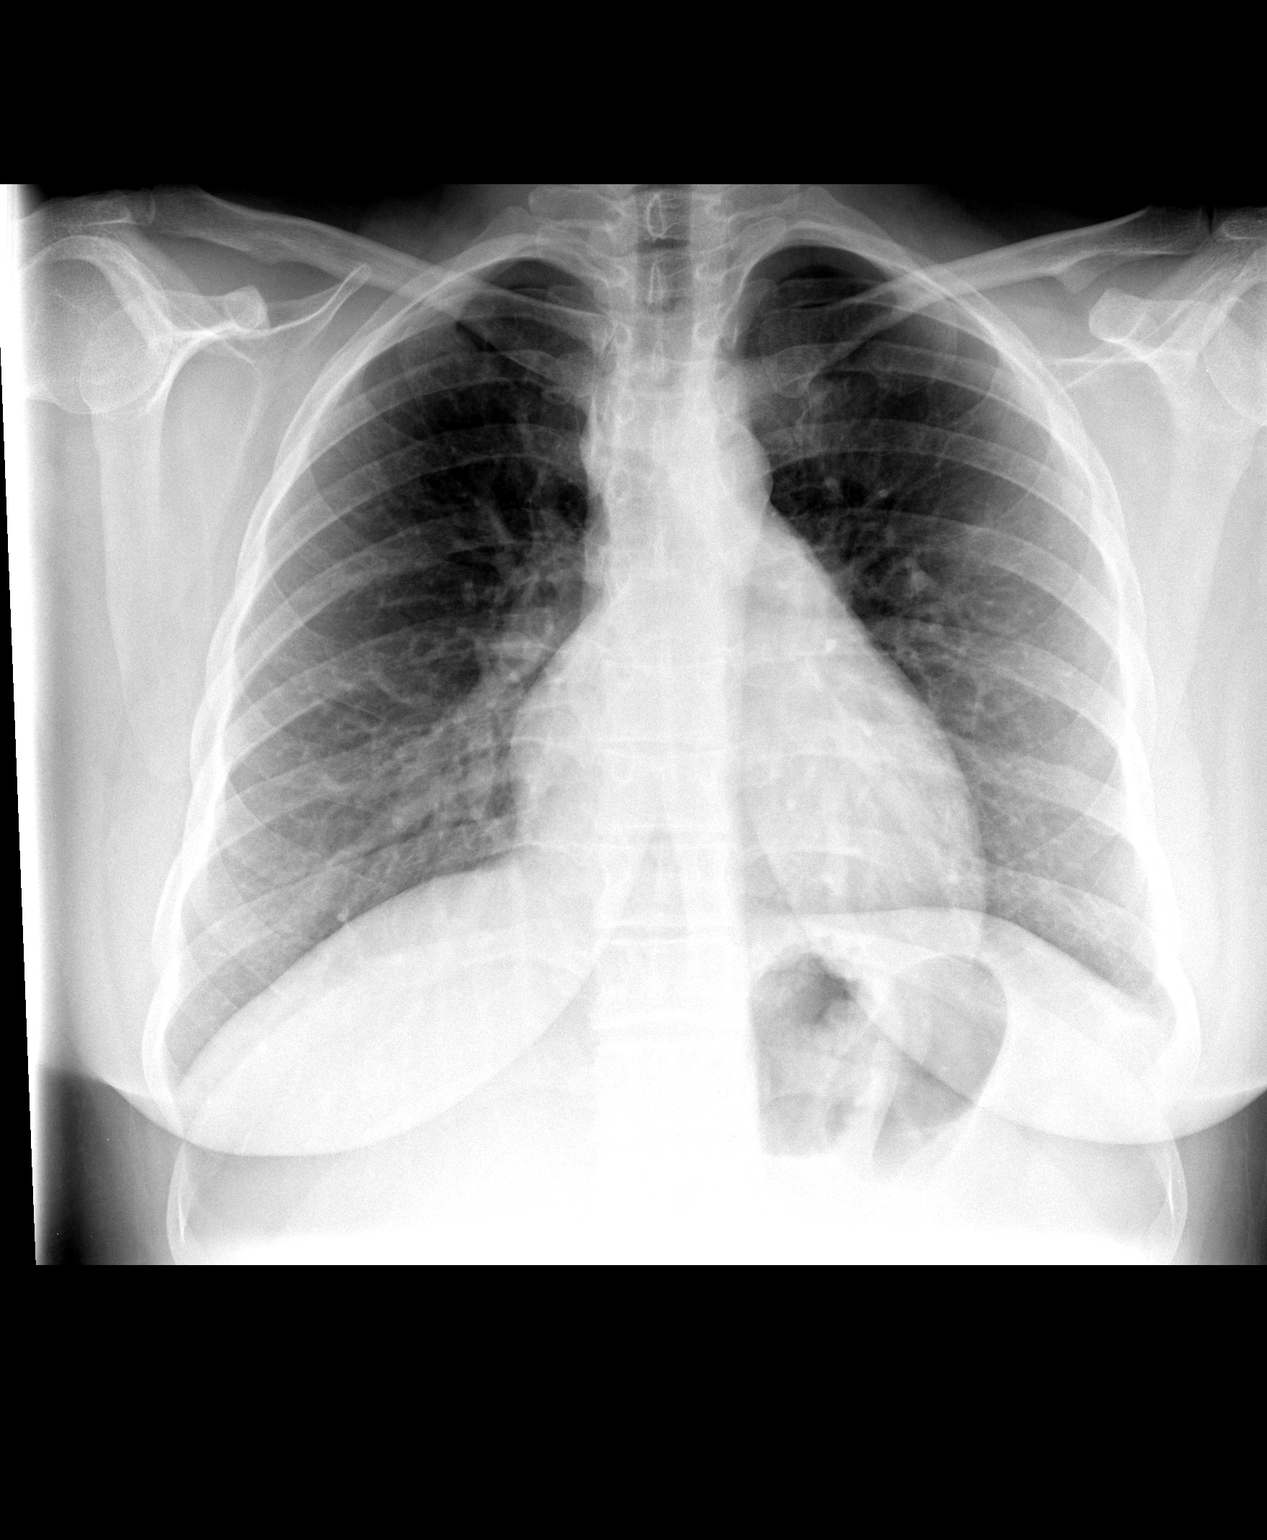

[view not recorded (2 of 2)]
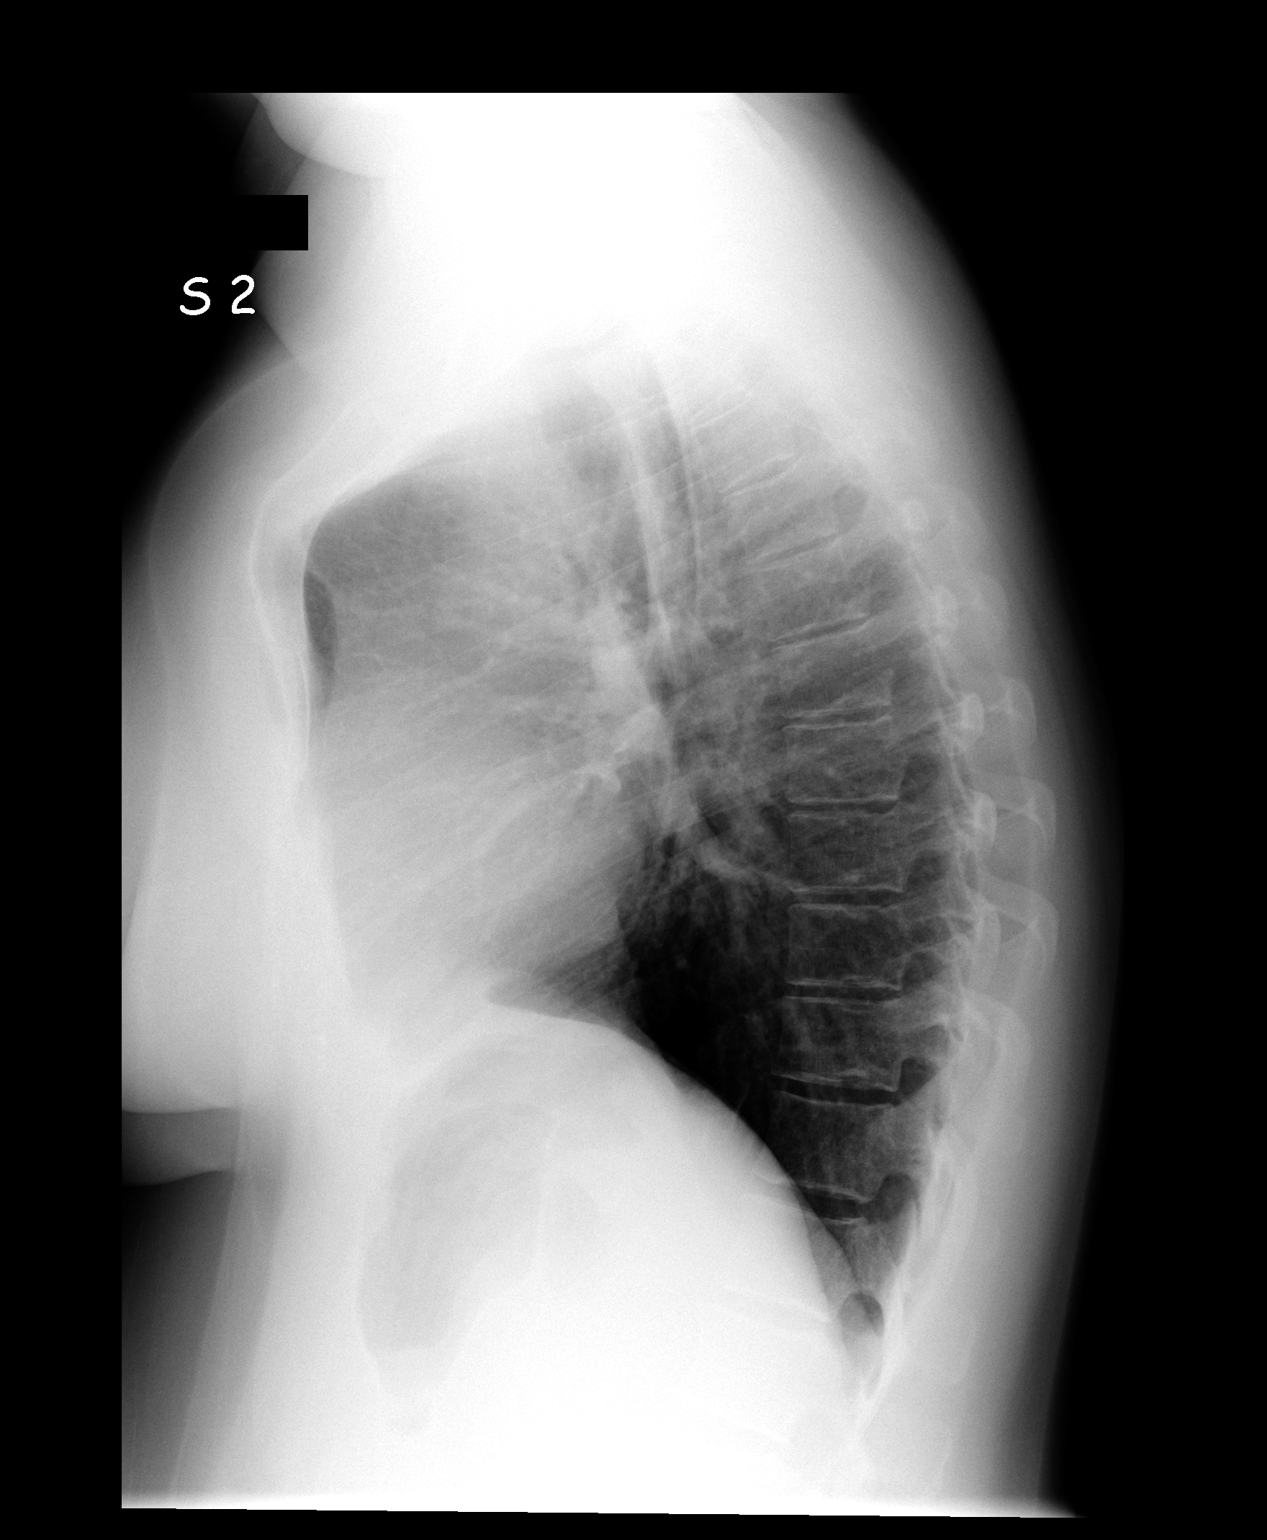

[2 of 2 positions shown; findings below may reference images not displayed]

FINDINGS: Cardiomediastinal silhouette is stable. No acute infiltrate or
pleural effusion. No pulmonary edema. Bony thorax is unremarkable.
IMPRESSION: No active cardiopulmonary disease.

## 2016-11-07 ENCOUNTER — Ambulatory Visit (INDEPENDENT_AMBULATORY_CARE_PROVIDER_SITE_OTHER): Payer: Medicare Other | Admitting: Podiatry

## 2016-11-07 ENCOUNTER — Encounter: Payer: Self-pay | Admitting: Podiatry

## 2016-11-07 ENCOUNTER — Encounter (INDEPENDENT_AMBULATORY_CARE_PROVIDER_SITE_OTHER): Payer: Self-pay

## 2016-11-07 VITALS — BP 103/65 | HR 83 | Ht 60.0 in | Wt 149.0 lb

## 2016-11-07 DIAGNOSIS — Q828 Other specified congenital malformations of skin: Secondary | ICD-10-CM

## 2016-11-07 NOTE — Progress Notes (Signed)
Subjective:    Patient ID: Donna Knight, female    DOB: June 09, 1985, 31 y.o.   MRN: [id number]  HPI   I have this thing growing under my left 2nd toe since January   31 y.o. female presents with the above complaint. Denies prior treatment. States the area is painful to touch and when she walks around on it. Pain described as sharp. Present for several months without resolution.  Past Medical History:  Diagnosis Date  . Asthma   . Lung disease, restrictive   . PE (pulmonary embolism)    Past Surgical History:  Procedure Laterality Date  . TONSILLECTOMY      Current Outpatient Prescriptions:  .  fluticasone furoate-vilanterol (BREO ELLIPTA) 100-25 MCG/INH AEPB, Inhale 1 puff into the lungs daily., Disp: , Rfl:  .  sertraline (ZOLOFT) 100 MG tablet, Take 150 mg by mouth., Disp: , Rfl:  .  acetaminophen (TYLENOL) 500 MG tablet, Take 1,000 mg by mouth every 8 (eight) hours as needed for fever., Disp: , Rfl:  .  Aromatic Inhalants (DECONGESTANT INHALER IN), Inhale 1 puff into the lungs 5 (five) times daily as needed (allergies)., Disp: , Rfl:  .  azithromycin (ZITHROMAX) 250 MG tablet, Take 250 mg by mouth daily., Disp: , Rfl:  .  ibuprofen (ADVIL,MOTRIN) 800 MG tablet, Take 1 tablet (800 mg total) by mouth 3 (three) times daily. (Patient not taking: Reported on 11/07/2016), Disp: 21 tablet, Rfl: 0 .  LORazepam (ATIVAN) 1 MG tablet, Take 1 mg by mouth every 8 (eight) hours as needed for anxiety., Disp: , Rfl:  .  metroNIDAZOLE (FLAGYL) 500 MG tablet, Take 1 tablet (500 mg total) by mouth 2 (two) times daily. (Patient not taking: Reported on 11/07/2016), Disp: 14 tablet, Rfl: 0 .  ondansetron (ZOFRAN) 4 MG tablet, Take 1 tablet (4 mg total) by mouth every 6 (six) hours. (Patient not taking: Reported on 11/07/2016), Disp: 12 tablet, Rfl: 0 .  PAROXETINE HCL PO, Take 1 tablet by mouth daily., Disp: , Rfl:   Allergies  Allergen Reactions  . Penicillins Anaphylaxis  . Sulfa Antibiotics  Anaphylaxis      Review of Systems  HENT: Positive for sore throat and trouble swallowing.   Eyes: Positive for redness.  Musculoskeletal: Positive for arthralgias, back pain, gait problem and myalgias.  Neurological: Positive for weakness, numbness and headaches.  All other systems reviewed and are negative.      Objective:   Physical Exam Vitals:   11/07/16 1033  BP: 103/65  Pulse: 83   General AA&O x3. Normal mood and affect.  Vascular Dorsalis pedis and posterior tibial pulses  present 2+ bilaterally  Capillary refill normal to all digits. Pedal hair growth normal.  Neurologic Epicritic sensation grossly present.  Dermatologic No open lesions. Interspaces clear of maceration. Nails well groomed and normal in appearance. Punctate hyperkeratotic lesion plantar surface of 2nd proximal phalanx.  Orthopedic: MMT 5/5 in dorsiflexion, plantarflexion, inversion, and eversion. Normal joint ROM without pain or crepitus.     Assessment & Plan:  Porokeratosis, L 2nd Toe -Educated on etiology. ? Verrucous in nature. -Lesion debrided and treated with Cantrone as below.  Procedure: Destruction of Lesion Location: L 2nd toe Anesthesia: none Instrumentation: 15 blade. Technique: Debridement of lesion with 15 blade. Aperture pad applied around lesion. Small amount of canthrone applied to the base of the lesion. Dressing: Dry, sterile, compression dressing. Disposition: Patient tolerated procedure well. Advised to leave dressing on for 6-8 hours. Thereafter patient to  wash the area with soap and water and applied band-aid. Off-loading pads dispensed. Patient to return in 2 weeks for follow-up.

## 2016-11-21 ENCOUNTER — Telehealth: Payer: Self-pay | Admitting: Podiatry

## 2016-11-21 ENCOUNTER — Ambulatory Visit (INDEPENDENT_AMBULATORY_CARE_PROVIDER_SITE_OTHER): Payer: Medicare Other | Admitting: Podiatry

## 2016-11-21 DIAGNOSIS — Q828 Other specified congenital malformations of skin: Secondary | ICD-10-CM | POA: Diagnosis not present

## 2016-11-21 MED ORDER — SALICYLIC ACID 6 % EX GEL: Freq: Every day | CUTANEOUS | 0 refills | Status: DC

## 2016-11-21 NOTE — Telephone Encounter (Signed)
Please schedule patient for an appointment.

## 2016-11-21 NOTE — Telephone Encounter (Signed)
I was seen a couple of weeks ago and the doctor cleaned out a clogged bile duct. I'm in more pain now than I was before. Is that normal? Please call me back at [phone].

## 2016-11-25 NOTE — Progress Notes (Signed)
   Subjective:    Patient ID: Freeman Caldron, female    DOB: 1985-08-22, 31 y.o.   MRN: [id number]  HPI 31 y.o. female returns for the above complaint. States that the lesion is bigger and is more painful last visit. States that the area blistered up significantly. Denies new issues.  Review of Systems     Objective:   Physical Exam There were no vitals filed for this visit. General AA&O x3. Normal mood and affect.  Vascular Dorsalis pedis and posterior tibial pulses  present 2+ bilaterally  Capillary refill normal to all digits. Pedal hair growth normal.  Neurologic Epicritic sensation grossly present.  Dermatologic Left second toe plantar DIPJ lesion with deep hyperkeratotic core Interspaces clear of maceration. Nails well groomed and normal in appearance.  Orthopedic: MMT 5/5 in dorsiflexion, plantarflexion, inversion, and eversion. Normal joint ROM without pain or crepitus.      Assessment & Plan:  Punctate keratosis left second toe; ? Verruca -RX salicylic acid gel. Patient educated on application -If bleeding persists we'll consider surgical excision.  Follow-up 3 weeks

## 2016-12-05 ENCOUNTER — Ambulatory Visit: Payer: Medicare Other | Admitting: Podiatry

## 2016-12-26 ENCOUNTER — Ambulatory Visit (INDEPENDENT_AMBULATORY_CARE_PROVIDER_SITE_OTHER): Payer: Medicare Other | Admitting: Vascular Surgery

## 2016-12-26 ENCOUNTER — Encounter: Payer: Self-pay | Admitting: Vascular Surgery

## 2016-12-26 VITALS — BP 101/69 | HR 78 | Resp 18 | Ht 60.0 in | Wt 151.3 lb

## 2016-12-26 DIAGNOSIS — I83891 Varicose veins of right lower extremities with other complications: Secondary | ICD-10-CM | POA: Diagnosis not present

## 2016-12-26 NOTE — Progress Notes (Signed)
Subjective:     Patient ID: Donna Knight, female   DOB: 1985/08/16, 31 y.o.   MRN: [id number]  HPI This 31 year old female was referred by Lauretta ChesterJodi ann Morehart,PA for evaluation of varicose veins. Patient states that she has discomfort in both thigh and posterior calf regions intermittently during the day. She has no history of DVT thrombophlebitis stasis ulcers or bleeding. She does have a remote history of pulmonary embolus in 2012 which was thought to be due to oral contraceptives and occurred 1 year following childbirth. She is not on chronic anticoagulation and is had no recurrent thrombotic problems he does not elastic compression stockings.  Past Medical History:  Diagnosis Date  . Asthma   . Lung disease, restrictive   . PE (pulmonary embolism)     Social History  Substance Use Topics  . Smoking status: Current Every Day Smoker    Packs/day: 0.50    Types: Cigarettes  . Smokeless tobacco: Never Used  . Alcohol use No    Family History  Problem Relation Age of Onset  . COPD Father   . Heart disease Father     Allergies  Allergen Reactions  . Penicillins Anaphylaxis  . Sulfa Antibiotics Anaphylaxis     Current Outpatient Prescriptions:  .  Aromatic Inhalants (DECONGESTANT INHALER IN), Inhale 1 puff into the lungs 5 (five) times daily as needed (allergies)., Disp: , Rfl:  .  sertraline (ZOLOFT) 100 MG tablet, Take 150 mg by mouth., Disp: , Rfl:  .  acetaminophen (TYLENOL) 500 MG tablet, Take 1,000 mg by mouth every 8 (eight) hours as needed for fever., Disp: , Rfl:  .  azithromycin (ZITHROMAX) 250 MG tablet, Take 250 mg by mouth daily., Disp: , Rfl:  .  fluticasone furoate-vilanterol (BREO ELLIPTA) 100-25 MCG/INH AEPB, Inhale 1 puff into the lungs daily., Disp: , Rfl:  .  ibuprofen (ADVIL,MOTRIN) 800 MG tablet, Take 1 tablet (800 mg total) by mouth 3 (three) times daily. (Patient not taking: Reported on 11/07/2016), Disp: 21 tablet, Rfl: 0 .  LORazepam (ATIVAN) 1 MG  tablet, Take 1 mg by mouth every 8 (eight) hours as needed for anxiety., Disp: , Rfl:  .  metroNIDAZOLE (FLAGYL) 500 MG tablet, Take 1 tablet (500 mg total) by mouth 2 (two) times daily. (Patient not taking: Reported on 11/07/2016), Disp: 14 tablet, Rfl: 0 .  ondansetron (ZOFRAN) 4 MG tablet, Take 1 tablet (4 mg total) by mouth every 6 (six) hours. (Patient not taking: Reported on 11/07/2016), Disp: 12 tablet, Rfl: 0 .  PAROXETINE HCL PO, Take 1 tablet by mouth daily., Disp: , Rfl:  .  salicylic acid 6 % gel, Apply topically daily. Apply small amount to wart area daily. Cover with band-aid. Discontinue if irritation develops. (Patient not taking: Reported on 12/26/2016), Disp: 40 g, Rfl: 0  Vitals:   12/26/16 1159  BP: 101/69  Pulse: 78  Resp: 18  SpO2: 99%  Weight: 151 lb 4.8 oz (68.6 kg)  Height: 5' (1.524 m)    Body mass index is 29.55 kg/m.         Review of Systems Denies chest pain, dyspnea on exertion, PND, orthopnea, hemoptysis    Objective:   Physical Exam BP 101/69 (BP Location: Left Arm, Patient Position: Sitting, Cuff Size: Normal)   Pulse 78   Resp 18   Ht 5' (1.524 m)   Wt 151 lb 4.8 oz (68.6 kg)   SpO2 99%   BMI 29.55 kg/m     Gen.-alert  and oriented x3 in no apparent distress HEENT normal for age Lungs no rhonchi or wheezing Cardiovascular regular rhythm no murmurs carotid pulses 3+ palpable no bruits audible Abdomen soft nontender no palpable masses Musculoskeletal free of  major deformities Skin clear -no rashes Neurologic normal Lower extremities 3+ femoral and dorsalis pedis pulses palpable bilaterally with no edema Right anterior thigh has a superficial network of prominent veins none larger in diameter than 2-3 mm which are visible and more prominent while standing area this is in the upper third of the thigh. No large bulging varicosities noted. Right posterior distal thigh has a circumferential network of reticular and spider veins about 3 cm to  4 cm in diameter. No hyperpigmentation or ulcerations noted. No venous abnormalities noted in the left leg.  Today I performed a bedside SonoSite ultrasound exam which reveals bilateral great saphenous veins of normal size with no evidence of reflux       Assessment:     A few small varicose veins right anterior thigh and reticular-spider veins right posterior thigh with no evidence of gross reflux in right great saphenous system History of pulmonary embolus 2012-etiology unknown possible oral contraceptives    Plan:     Discussed with patient that these small venous abnormalities in the right leg could be treated with foam sclerotherapy if she desired that there is no indication for a significant intervention such as laser ablation in either lower extremity She is not interested in proceeding with these and I did not recommend it

## 2017-04-04 ENCOUNTER — Ambulatory Visit: Payer: Medicare Other | Admitting: Allergy

## 2018-11-19 ENCOUNTER — Ambulatory Visit (INDEPENDENT_AMBULATORY_CARE_PROVIDER_SITE_OTHER): Payer: Medicare Other | Admitting: Cardiology

## 2018-11-19 ENCOUNTER — Other Ambulatory Visit: Payer: Self-pay

## 2018-11-19 ENCOUNTER — Encounter: Payer: Self-pay | Admitting: Cardiology

## 2018-11-19 VITALS — HR 96 | Ht 60.0 in | Wt 174.8 lb

## 2018-11-19 DIAGNOSIS — R002 Palpitations: Secondary | ICD-10-CM | POA: Diagnosis not present

## 2018-11-19 NOTE — Patient Instructions (Signed)
Medication Instructions:  Your physician recommends that you continue on your current medications as directed. Please refer to the Current Medication list given to you today.  If you need a refill on your cardiac medications before your next appointment, please call your pharmacy.   Lab work: None If you have labs (blood work) drawn today and your tests are completely normal, you will receive your results only by: Marland Kitchen MyChart Message (if you have MyChart) OR . A paper copy in the mail If you have any lab test that is abnormal or we need to change your treatment, we will call you to review the results.  Testing/Procedures: Your physician has requested that you have an echocardiogram. Echocardiography is a painless test that uses sound waves to create images of your heart. It provides your doctor with information about the size and shape of your heart and how well your heart's chambers and valves are working. This procedure takes approximately one hour. There are no restrictions for this procedure.  Your physician has recommended that you wear a ZIO monitor. ZIO monitors are medical devices that record the heart's electrical activity. Doctors most often use these monitors to diagnose arrhythmias. Arrhythmias are problems with the speed or rhythm of the heartbeat. The monitor is a small, portable device. You can wear one while you do your normal daily activities. This is usually used to diagnose what is causing palpitations/syncope (passing out).  Wear 7 days   Follow-Up: At [facility], you and your health needs are our priority.  As part of our continuing mission to provide you with exceptional heart care, we have created designated Provider Care Teams.  These Care Teams include your primary Cardiologist (physician) and Advanced Practice Providers (APPs -  Physician Assistants and Nurse Practitioners) who all work together to provide you with the care you need, when you need it. You will need a  follow up appointment in 2 months with DrTobb Any Other Special Instructions Will Be Listed Below (If Applicable).   Echocardiogram An echocardiogram is a procedure that uses painless sound waves (ultrasound) to produce an image of the heart. Images from an echocardiogram can provide important information about:  Signs of coronary artery disease (CAD).  Aneurysm detection. An aneurysm is a weak or damaged part of an artery wall that bulges out from the normal force of blood pumping through the body.  Heart size and shape. Changes in the size or shape of the heart can be associated with certain conditions, including heart failure, aneurysm, and CAD.  Heart muscle function.  Heart valve function.  Signs of a past heart attack.  Fluid buildup around the heart.  Thickening of the heart muscle.  A tumor or infectious growth around the heart valves. Tell a health care provider about:  Any allergies you have.  All medicines you are taking, including vitamins, herbs, eye drops, creams, and over-the-counter medicines.  Any blood disorders you have.  Any surgeries you have had.  Any medical conditions you have.  Whether you are pregnant or may be pregnant. What are the risks? Generally, this is a safe procedure. However, problems may occur, including:  Allergic reaction to dye (contrast) that may be used during the procedure. What happens before the procedure? No specific preparation is needed. You may eat and drink normally. What happens during the procedure?   An IV tube may be inserted into one of your veins.  You may receive contrast through this tube. A contrast is an injection that  improves the quality of the pictures from your heart.  A gel will be applied to your chest.  A wand-like tool (transducer) will be moved over your chest. The gel will help to transmit the sound waves from the transducer.  The sound waves will harmlessly bounce off of your heart to allow  the heart images to be captured in real-time motion. The images will be recorded on a computer. The procedure may vary among health care providers and hospitals. What happens after the procedure?  You may return to your normal, everyday life, including diet, activities, and medicines, unless your health care provider tells you not to do that. Summary  An echocardiogram is a procedure that uses painless sound waves (ultrasound) to produce an image of the heart.  Images from an echocardiogram can provide important information about the size and shape of your heart, heart muscle function, heart valve function, and fluid buildup around your heart.  You do not need to do anything to prepare before this procedure. You may eat and drink normally.  After the echocardiogram is completed, you may return to your normal, everyday life, unless your health care provider tells you not to do that. This information is not intended to replace advice given to you by your health care provider. Make sure you discuss any questions you have with your health care provider. Document Released: 02/12/2000 Document Revised: 06/07/2018 Document Reviewed: 03/19/2016 Elsevier Patient Education  2020 [employer]

## 2018-11-19 NOTE — Progress Notes (Signed)
Cardiology Office Note:    Date:  11/20/2018   ID:  Donna Knight, DOB 1985-10-17, MRN [id number]  PCP:  Olive Bassough, Robert L, MD  Cardiologist:  No primary care provider on file.  Electrophysiologist:  None   Referring MD: Olive Bassough, Robert L, MD   The patient is referred by her primary care physician for palpitations. ASSESSMENT:    1. Palpitations    PLAN:     1.  I am concerned that her palpitation may be an abnormal atrial rhythm.  Therefore is appropriate to do ambulatory monitoring.  At this time a ZIO patch for 7 days has been ordered.  In addition echocardiogram is appropriate to assess for any structural abnormalities.  2.  She was advised to go to the ED if this recurs and persists.  3.  She notes that her recent thyroid test was normal she follows with endocrinologist and is she has been ruled out for PCOS.   The patient is in agreement with the above plan. The patient left the office in stable condition.  The patient will follow up in 2 months  Medication Adjustments/Labs and Tests Ordered: Current medicines are reviewed at length with the patient today.  Concerns regarding medicines are outlined above.  Orders Placed This Encounter  Procedures  . LONG TERM MONITOR (3-14 DAYS)  . ECHOCARDIOGRAM COMPLETE      History of Present Illness:    Donna Knight is a 33 y.o. female with a hx of pulmonary embolus no longer anticoagulation, pericarditis, asthma, bipolar disorder, schizophrenia, PTSD presents today to be evaluated for palpitations.  The patient reports over last 9 months she has been experiencing intermittent palpitation which recently has become more frequent.  She notes that initially when it happened she will be taken to the ED and by the time she got there palpitation would have resolved.  In the last several months she has been ruled out for pulmonary embolism with her last CT scan being in July 2020 at which time there was no evidence of pulmonary  embolism.  She states that she could be doing anything sitting or lying down and she would experience sudden rise of her heart rate which feels like a pounding sensation, last for about 30 seconds to a minute prior to resolving.  She admits associated chest fullness and neck fullness however denies shortness of breath.  At times she tells me she does have some dizziness. Of note she smokes occasional marijuana however denies any association.  She has not experienced a palpitation during her encounter.   Past Medical History:  Diagnosis Date  . Asthma   . Lung disease, restrictive   . PE (pulmonary embolism)   . Pericarditis     Past Surgical History:  Procedure Laterality Date  . cold knife    . femoralplasty    . TONSILLECTOMY      Current Medications: Current Meds  Medication Sig  . acetaminophen (TYLENOL) 500 MG tablet Take 1,000 mg by mouth every 8 (eight) hours as needed for fever.  Marland Kitchen. albuterol (VENTOLIN HFA) 108 (90 Base) MCG/ACT inhaler Inhale into the lungs every 6 (six) hours as needed for wheezing or shortness of breath.  Marland Kitchen. amitriptyline (ELAVIL) 25 MG tablet Take 25 mg by mouth at bedtime as needed for sleep.  . budesonide-formoterol (SYMBICORT) 160-4.5 MCG/ACT inhaler Inhale 2 puffs into the lungs 2 (two) times daily.  Marland Kitchen. gabapentin (NEURONTIN) 300 MG capsule Take 300 mg by mouth 3 (three) times daily.  .Marland Kitchen  hydrOXYzine (ATARAX/VISTARIL) 50 MG tablet Take 50 mg by mouth 3 (three) times daily as needed.  Marland Kitchen ibuprofen (ADVIL,MOTRIN) 800 MG tablet Take 1 tablet (800 mg total) by mouth 3 (three) times daily.  Marland Kitchen lamoTRIgine (LAMICTAL) 150 MG tablet Take 150 mg by mouth 2 (two) times daily.  . propranolol (INDERAL) 10 MG tablet Take 10 mg by mouth 2 (two) times daily as needed.  . risperiDONE (RISPERDAL) 3 MG tablet Take 3 mg by mouth daily.  . sertraline (ZOLOFT) 100 MG tablet Take 150 mg by mouth.  Marland Kitchen tiZANidine (ZANAFLEX) 4 MG tablet Take 4 mg by mouth at bedtime.      Allergies:   Penicillins and Sulfa antibiotics   Social History   Socioeconomic History  . Marital status: Single    Spouse name: Not on file  . Number of children: Not on file  . Years of education: Not on file  . Highest education level: Not on file  Occupational History  . Not on file  Social Needs  . Financial resource strain: Not on file  . Food insecurity    Worry: Not on file    Inability: Not on file  . Transportation needs    Medical: Not on file    Non-medical: Not on file  Tobacco Use  . Smoking status: Former Smoker    Packs/day: 0.50    Types: Cigarettes    Quit date: 2020    Years since quitting: 0.7  . Smokeless tobacco: Never Used  Substance and Sexual Activity  . Alcohol use: No  . Drug use: Yes    Types: Marijuana    Comment: uses marijuana occassionally -states help with her manic side of her bipolar disorder.  . Sexual activity: Not on file  Lifestyle  . Physical activity    Days per week: Not on file    Minutes per session: Not on file  . Stress: Not on file  Relationships  . Social Herbalist on phone: Not on file    Gets together: Not on file    Attends religious service: Not on file    Active member of club or organization: Not on file    Attends meetings of clubs or organizations: Not on file    Relationship status: Not on file  Other Topics Concern  . Not on file  Social History Narrative  . Not on file     Family History: The patient's family history includes Atrial fibrillation in her father; COPD in her father; Cancer in her maternal grandfather; Congestive Heart Failure in her father and paternal grandmother; Dementia in her maternal grandmother; Heart attack in her father and paternal grandmother; Heart disease in her father; Kidney failure in her maternal grandmother; Ovarian cancer in her maternal grandmother.  ROS:   Review of Systems  Constitution: Negative for decreased appetite, fever and weight gain.  HENT:  Negative for congestion, ear discharge, hoarse voice and sore throat.   Eyes: Negative for discharge, redness, vision loss in right eye and visual halos.  Cardiovascular: She reports palpitations.  Negative for chest pain, dyspnea on exertion, leg swelling, orthopnea.  Respiratory: Negative for cough, hemoptysis, shortness of breath and snoring.   Endocrine: Negative for heat intolerance and polyphagia.  Hematologic/Lymphatic: Negative for bleeding problem. Does not bruise/bleed easily.  Skin: Negative for flushing, nail changes, rash and suspicious lesions.  Musculoskeletal: Negative for arthritis, joint pain, muscle cramps, myalgias, neck pain and stiffness.  Gastrointestinal: Negative for abdominal pain,  bowel incontinence, diarrhea and excessive appetite.  Genitourinary: Negative for decreased libido, genital sores and incomplete emptying.  Neurological: Negative for brief paralysis, focal weakness, headaches and loss of balance.  Psychiatric/Behavioral: Negative for altered mental status, depression and suicidal ideas.  Allergic/Immunologic: Negative for HIV exposure and persistent infections.    EKGs/Labs/Other Studies Reviewed:    The following studies were reviewed today:  EKG:  The ekg ordered today demonstrates sinus rhythm, heart rate 70 bpm.  There is evidence of short PR interval.  Similar to EKG performed April 2015.  Recent Labs: No results found for requested labs within last 8760 hours.  Recent Lipid Panel No results found for: CHOL, TRIG, HDL, CHOLHDL, VLDL, LDLCALC, LDLDIRECT  Physical Exam:    VS:  Pulse 96   Ht 5' (1.524 m)   Wt 174 lb 12.8 oz (79.3 kg)   SpO2 99%   BMI 34.14 kg/m     Wt Readings from Last 3 Encounters:  11/19/18 174 lb 12.8 oz (79.3 kg)  12/26/16 151 lb 4.8 oz (68.6 kg)  11/07/16 149 lb (67.6 kg)     GEN: Patient obese, well nourished, well developed in no acute distress HEENT: Normal NECK: No JVD; No carotid bruits LYMPHATICS: No  lymphadenopathy CARDIAC: S1S2 noted,RRR, no murmurs, rubs, gallops RESPIRATORY:  Clear to auscultation without rales, wheezing or rhonchi  ABDOMEN: Soft, non-tender, non-distended, +bowel sounds, no guarding. EXTREMITIES: No edema, No cyanosis, no clubbing MUSCULOSKELETAL:  No edema; No deformity  SKIN: Warm and dry, tattoos NEUROLOGIC:  Alert and oriented x 3, non-focal PSYCHIATRIC:  Normal affect, good insight  No orders of the defined types were placed in this encounter.   Patient Instructions  Medication Instructions:  Your physician recommends that you continue on your current medications as directed. Please refer to the Current Medication list given to you today.  If you need a refill on your cardiac medications before your next appointment, please call your pharmacy.   Lab work: None If you have labs (blood work) drawn today and your tests are completely normal, you will receive your results only by: Marland Kitchen MyChart Message (if you have MyChart) OR . A paper copy in the mail If you have any lab test that is abnormal or we need to change your treatment, we will call you to review the results.  Testing/Procedures: Your physician has requested that you have an echocardiogram. Echocardiography is a painless test that uses sound waves to create images of your heart. It provides your doctor with information about the size and shape of your heart and how well your heart's chambers and valves are working. This procedure takes approximately one hour. There are no restrictions for this procedure.  Your physician has recommended that you wear a ZIO monitor. ZIO monitors are medical devices that record the heart's electrical activity. Doctors most often use these monitors to diagnose arrhythmias. Arrhythmias are problems with the speed or rhythm of the heartbeat. The monitor is a small, portable device. You can wear one while you do your normal daily activities. This is usually used to diagnose  what is causing palpitations/syncope (passing out).  Wear 7 days   Follow-Up: At [facility], you and your health needs are our priority.  As part of our continuing mission to provide you with exceptional heart care, we have created designated Provider Care Teams.  These Care Teams include your primary Cardiologist (physician) and Advanced Practice Providers (APPs -  Physician Assistants and Nurse Practitioners) who all work together  to provide you with the care you need, when you need it. You will need a follow up appointment in 2 months with DrTobb Any Other Special Instructions Will Be Listed Below (If Applicable).   Echocardiogram An echocardiogram is a procedure that uses painless sound waves (ultrasound) to produce an image of the heart. Images from an echocardiogram can provide important information about:  Signs of coronary artery disease (CAD).  Aneurysm detection. An aneurysm is a weak or damaged part of an artery wall that bulges out from the normal force of blood pumping through the body.  Heart size and shape. Changes in the size or shape of the heart can be associated with certain conditions, including heart failure, aneurysm, and CAD.  Heart muscle function.  Heart valve function.  Signs of a past heart attack.  Fluid buildup around the heart.  Thickening of the heart muscle.  A tumor or infectious growth around the heart valves. Tell a health care provider about:  Any allergies you have.  All medicines you are taking, including vitamins, herbs, eye drops, creams, and over-the-counter medicines.  Any blood disorders you have.  Any surgeries you have had.  Any medical conditions you have.  Whether you are pregnant or may be pregnant. What are the risks? Generally, this is a safe procedure. However, problems may occur, including:  Allergic reaction to dye (contrast) that may be used during the procedure. What happens before the procedure? No specific  preparation is needed. You may eat and drink normally. What happens during the procedure?   An IV tube may be inserted into one of your veins.  You may receive contrast through this tube. A contrast is an injection that improves the quality of the pictures from your heart.  A gel will be applied to your chest.  A wand-like tool (transducer) will be moved over your chest. The gel will help to transmit the sound waves from the transducer.  The sound waves will harmlessly bounce off of your heart to allow the heart images to be captured in real-time motion. The images will be recorded on a computer. The procedure may vary among health care providers and hospitals. What happens after the procedure?  You may return to your normal, everyday life, including diet, activities, and medicines, unless your health care provider tells you not to do that. Summary  An echocardiogram is a procedure that uses painless sound waves (ultrasound) to produce an image of the heart.  Images from an echocardiogram can provide important information about the size and shape of your heart, heart muscle function, heart valve function, and fluid buildup around your heart.  You do not need to do anything to prepare before this procedure. You may eat and drink normally.  After the echocardiogram is completed, you may return to your normal, everyday life, unless your health care provider tells you not to do that. This information is not intended to replace advice given to you by your health care provider. Make sure you discuss any questions you have with your health care provider. Document Released: 02/12/2000 Document Revised: 06/07/2018 Document Reviewed: 03/19/2016 Elsevier Patient Education  2020 [employer]       Adopting a Healthy Lifestyle.  Know what a healthy weight is for you (roughly BMI <25) and aim to maintain this   Aim for 7+ servings of fruits and vegetables daily   65-80+ fluid ounces of  water or unsweet tea for healthy kidneys   Limit to max 1 drink  of alcohol per day; avoid smoking/tobacco   Limit animal fats in diet for cholesterol and heart health - choose grass fed whenever available   Avoid highly processed foods, and foods high in saturated/trans fats   Aim for low stress - take time to unwind and care for your mental health   Aim for 150 min of moderate intensity exercise weekly for heart health, and weights twice weekly for bone health   Aim for 7-9 hours of sleep daily   When it comes to diets, agreement about the perfect plan isnt easy to find, even among the experts. Experts at the [facility] [employer] developed an idea known as the Healthy Eating Plate. Just imagine a plate divided into logical, healthy portions.   The emphasis is on diet quality:   Load up on vegetables and fruits - one-half of your plate: Aim for color and variety, and remember that potatoes dont count.   Go for whole grains - one-quarter of your plate: Whole wheat, barley, wheat berries, quinoa, oats, brown rice, and foods made with them. If you want pasta, go with whole wheat pasta.   Protein power - one-quarter of your plate: Fish, chicken, beans, and nuts are all healthy, versatile protein sources. Limit red meat.   The diet, however, does go beyond the plate, offering a few other suggestions.   Use healthy plant oils, such as olive, canola, soy, corn, sunflower and peanut. Check the labels, and avoid partially hydrogenated oil, which have unhealthy trans fats.   If youre thirsty, drink water. Coffee and tea are good in moderation, but skip sugary drinks and limit milk and dairy products to one or two daily servings.   The type of carbohydrate in the diet is more important than the amount. Some sources of carbohydrates, such as vegetables, fruits, whole grains, and beans-are healthier than others.   Finally, stay active  Signed, Thomasene Ripple, DO  11/20/2018 9:01 AM     [facility]

## 2018-11-20 ENCOUNTER — Encounter: Payer: Self-pay | Admitting: Cardiology

## 2018-11-20 NOTE — Addendum Note (Signed)
Addended by: Stevan Born on: 11/20/2018 04:36 PM   Modules accepted: Orders

## 2018-11-23 ENCOUNTER — Ambulatory Visit (INDEPENDENT_AMBULATORY_CARE_PROVIDER_SITE_OTHER): Payer: Medicare Other

## 2018-11-23 DIAGNOSIS — R002 Palpitations: Secondary | ICD-10-CM

## 2018-12-21 ENCOUNTER — Other Ambulatory Visit: Payer: Self-pay

## 2018-12-21 ENCOUNTER — Ambulatory Visit (INDEPENDENT_AMBULATORY_CARE_PROVIDER_SITE_OTHER): Payer: Medicare Other

## 2018-12-21 DIAGNOSIS — R002 Palpitations: Secondary | ICD-10-CM

## 2018-12-21 NOTE — Progress Notes (Signed)
Complete echocardiogram has been performed.  Jimmy Meelah Tallo RDCS, RVT 

## 2019-01-21 ENCOUNTER — Ambulatory Visit: Payer: Medicare Other | Admitting: Cardiology

## 2020-08-06 ENCOUNTER — Encounter: Payer: Self-pay | Admitting: Oncology

## 2020-08-06 ENCOUNTER — Telehealth: Payer: Self-pay | Admitting: *Deleted

## 2020-08-06 NOTE — Telephone Encounter (Signed)
Got a fax from [facility] with results of DHEA level. This pt is not a pt. Here at cancer center. You can see the results in care everywhere. The ordering doctor is Dr. Carollee Leitz dipak Smith Robert. I assume this result came to Dr. Owens Shark by mistake. There was no telephone number attached in care everywhere for Dr. Brennan Bailey. Based on the levels are in the computer under [facility] the MD will see results

## 2023-07-10 ENCOUNTER — Ambulatory Visit (HOSPITAL_BASED_OUTPATIENT_CLINIC_OR_DEPARTMENT_OTHER): Admission: EM | Admit: 2023-07-10 | Discharge: 2023-07-10 | Disposition: A | Discharge status: Home / Self Care

## 2023-07-10 ENCOUNTER — Ambulatory Visit (HOSPITAL_BASED_OUTPATIENT_CLINIC_OR_DEPARTMENT_OTHER)
Admit: 2023-07-10 | Discharge: 2023-07-10 | Disposition: A | Discharge status: Home / Self Care | Attending: Family Medicine | Admitting: Radiology

## 2023-07-10 ENCOUNTER — Other Ambulatory Visit (HOSPITAL_BASED_OUTPATIENT_CLINIC_OR_DEPARTMENT_OTHER): Payer: Self-pay

## 2023-07-10 ENCOUNTER — Encounter (HOSPITAL_BASED_OUTPATIENT_CLINIC_OR_DEPARTMENT_OTHER): Payer: Self-pay | Admitting: Emergency Medicine

## 2023-07-10 DIAGNOSIS — M25512 Pain in left shoulder: Secondary | ICD-10-CM | POA: Diagnosis not present

## 2023-07-10 MED ORDER — CELECOXIB 200 MG PO CAPS
200.0000 mg | ORAL_CAPSULE | Freq: Two times a day (BID) | ORAL | 0 refills | Status: AC | PRN
  Filled 2023-07-10: qty 30, 15d supply, fill #0

## 2023-07-10 MED ORDER — KETOROLAC TROMETHAMINE 60 MG/2ML IM SOLN
60.0000 mg | Freq: Once | INTRAMUSCULAR | Status: AC
  Administered 2023-07-10: 60 mg via INTRAMUSCULAR

## 2023-07-10 NOTE — ED Provider Notes (Signed)
 [name]    CSN: [id number] Arrival date & time: 07/10/23  0853      History   Chief Complaint Chief Complaint  Patient presents with   Shoulder Pain    HPI Donna Knight is a 38 y.o. female.   Patient is here with acute left shoulder pain since 07/08/2023.  It was during the night of 07/07/2023 and into the morning of 07/08/2023.  She was sleeping and somehow her left arm and shoulder got caught underneath her as she was laying on her side.  She could not get her arm out she was having severe pain.  She tried moving several different directions in her arm was just hung up or caught underneath her.  She eventually had to roll out of bed to get herself off her arm.  She has pain if she relaxes the arm to a straight position.  She is keeping it bent up and close to her chest.  If she moves the arm or shoulder she has 10 out of 10 pain.  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  She feels like she has done something similar with the shoulder.   Shoulder Pain Associated symptoms: no back pain and no fever     Past Medical History:  Diagnosis Date   Asthma    Lung disease, restrictive    PE (pulmonary embolism)    Pericarditis     Patient Active Problem List   Diagnosis Date Noted   Symptomatic varicose veins, right 12/26/2016    Past Surgical History:  Procedure Laterality Date   cold knife     femoralplasty     TONSILLECTOMY      OB History   No obstetric history on file.      Home Medications    Prior to Admission medications   Medication Sig Start Date End Date Taking? Authorizing Provider  busPIRone (BUSPAR) 7.5 MG tablet Take 7.5 mg by mouth 2 (two) times daily. 06/28/23  Yes [provider]  celecoxib (CELEBREX) 200 MG capsule Take 1 capsule (200 mg total) by mouth 2 (two) times daily as needed for up to 15 days. 07/10/23 07/25/23 Yes Guss Legacy, FNP  acetaminophen (TYLENOL)  500 MG tablet Take 1,000 mg by mouth every 8 (eight) hours as needed for fever.    [provider]  albuterol (VENTOLIN HFA) 108 (90 Base) MCG/ACT inhaler Inhale into the lungs every 6 (six) hours as needed for wheezing or shortness of breath.    [provider]  amitriptyline (ELAVIL) 25 MG tablet Take 25 mg by mouth at bedtime as needed for sleep.    [provider]  ARIPiprazole (ABILIFY) 15 MG tablet Take by mouth.    [provider]  budesonide-formoterol (SYMBICORT) 160-4.5 MCG/ACT inhaler Inhale 2 puffs into the lungs 2 (two) times daily.    [provider]  gabapentin (NEURONTIN) 300 MG capsule Take 300 mg by mouth 3 (three) times daily.    [provider]  hydrOXYzine (ATARAX/VISTARIL) 50 MG tablet Take 50 mg by mouth 3 (three) times daily as needed.    [provider]  ibuprofen  (ADVIL ,MOTRIN ) 800 MG tablet Take 1 tablet (800 mg total) by mouth 3 (three) times daily. 06/06/13   Rancour, Mara Seminole, MD  paragard intrauterine copper IUD IUD by Intrauterine route.    [provider]  propranolol (INDERAL) 10 MG tablet Take 10 mg by mouth 2 (two) times  daily as needed.    [provider]  risperiDONE (RISPERDAL) 3 MG tablet Take 3 mg by mouth daily.    [provider]  tiZANidine (ZANAFLEX) 4 MG tablet Take 4 mg by mouth at bedtime.    [provider]    Family History Family History  Problem Relation Age of Onset   COPD Father    Heart disease Father    Congestive Heart Failure Father    Atrial fibrillation Father    Heart attack Father    Kidney failure Maternal Grandmother    Dementia Maternal Grandmother    Ovarian cancer Maternal Grandmother    Cancer Maternal Grandfather    Congestive Heart Failure Paternal Grandmother    Heart attack Paternal Grandmother     Social History Social History   Tobacco Use   Smoking status: Every Day    Current packs/day: 0.00    Types: Cigarettes     Last attempt to quit: 2020    Years since quitting: 5.3   Smokeless tobacco: Never  Vaping Use   Vaping status: Never Used  Substance Use Topics   Alcohol use: No   Drug use: Not Currently    Types: Marijuana    Comment: uses marijuana occassionally -states help with her manic side of her bipolar disorder.     Allergies   Penicillins and Sulfa antibiotics   Review of Systems Review of Systems  Constitutional:  Negative for fever.  Respiratory:  Negative for cough.   Cardiovascular:  Negative for chest pain.  Gastrointestinal:  Negative for abdominal pain, constipation, diarrhea, nausea and vomiting.  Musculoskeletal:  Positive for arthralgias (left shoulder pain). Negative for back pain.  Skin:  Negative for color change and rash.  Neurological:  Negative for syncope.  All other systems reviewed and are negative.    Physical Exam Triage Vital Signs ED Triage Vitals  Encounter Vitals Group     BP 07/10/23 0940 121/84     Systolic BP Percentile --      Diastolic BP Percentile --      Pulse Rate 07/10/23 0940 75     Resp 07/10/23 0940 18     Temp 07/10/23 0940 98.4 F (36.9 C)     Temp Source 07/10/23 0940 Oral     SpO2 07/10/23 0940 (!) 76 %     Weight --      Height --      Head Circumference --      Peak Flow --      Pain Score 07/10/23 0934 7     Pain Loc --      Pain Education --      Exclude from Growth Chart --    No data found.  Updated Vital Signs BP 121/84 (BP Location: Right Arm)   Pulse 75   Temp 98.4 F (36.9 C) (Oral)   Resp 18   LMP 07/08/2023 (Approximate)   SpO2 (!) 76%   Visual Acuity Right Eye Distance:   Left Eye Distance:   Bilateral Distance:    Right Eye Near:   Left Eye Near:    Bilateral Near:     Physical Exam Vitals and nursing note reviewed.  Constitutional:      General: She is not in acute distress.    Appearance: She is well-developed. She is not ill-appearing or toxic-appearing.  HENT:     Head:  Normocephalic and atraumatic.     Right Ear: External ear normal.  Left Ear: External ear normal.     Nose: Nose normal.     Mouth/Throat:     Lips: Pink.     Mouth: Mucous membranes are moist.  Eyes:     Conjunctiva/sclera: Conjunctivae normal.     Pupils: Pupils are equal, round, and reactive to light.  Cardiovascular:     Rate and Rhythm: Normal rate and regular rhythm.     Heart sounds: S1 normal and S2 normal. No murmur heard. Pulmonary:     Effort: Pulmonary effort is normal. No respiratory distress.     Breath sounds: Normal breath sounds. No decreased breath sounds, wheezing, rhonchi or rales.  Musculoskeletal:        General: No swelling.     Right shoulder: Normal.     Left shoulder: Tenderness present. Decreased range of motion (due to pain).  Skin:    General: Skin is warm and dry.     Capillary Refill: Capillary refill takes less than 2 seconds.     Findings: No rash.  Neurological:     Mental Status: She is alert and oriented to person, place, and time.  Psychiatric:        Mood and Affect: Mood normal.      UC Treatments / Results  Labs (all labs ordered are listed, but only abnormal results are displayed) Labs Reviewed - No data to display Comprehensive Metabolic Panel: 08/08/22:  Order: [id number] Component Ref Range & Units 11 mo ago  Sodium 136 - 145 mmol/L 138  Potassium 3.5 - 5.1 mmol/L 4.6  Comment: NO VISIBLE HEMOLYSIS  Chloride 98 - 107 mmol/L 105  CO2 21 - 31 mmol/L 26  Anion Gap 6 - 14 mmol/L 7  Glucose, Random 70 - 99 mg/dL 84  Blood Urea Nitrogen (BUN) 7 - 25 mg/dL 10  Creatinine 6.29 - 5.28 mg/dL 4.13  eGFR >24 MW/NUU/7.25D6 >90  Comment: GFR estimated by CKD-EPI equations(NKF 2021).  "Recommend confirmation of Cr-based eGFR by using Cys-based eGFR and other filtration markers (if applicable) in complex cases and clinical decision-making, as needed."  Albumin 3.5 - 5.7 g/dL 4.4  Total Protein 6.4 - 8.9 g/dL 6.4   Bilirubin, Total 0.3 - 1.0 mg/dL 0.3  Alkaline Phosphatase (ALP) 34 - 104 U/L 55  Aspartate Aminotransferase (AST) 13 - 39 U/L 12 Low   Alanine Aminotransferase (ALT) 7 - 52 U/L 13  Calcium 8.6 - 10.3 mg/dL 9.1  BUN/Creatinine Ratio   Comment: Creatinine is normal, ratio is not clinically indicated.  Resulting Agency [facility](CLIA# [id number])    EKG   Radiology No results found.  Procedures Procedures (including critical care time)  Medications Ordered in UC Medications  ketorolac  (TORADOL ) injection 60 mg (60 mg Intramuscular Given 07/10/23 1045)    Initial Impression / Assessment and Plan / UC Course  I have reviewed the triage vital signs and the nursing notes.  Pertinent labs & imaging results that were available during my care of the patient were reviewed by me and considered in my medical decision making (see chart for details).     Plan of Care: Acute left shoulder pain: X-rays appear negative.  Will update the patient if the radiology impression differs.  Patient has a sling on her left arm.  Celebrex 200 mg twice daily as needed for pain.  Take the Celebrex with food.  Ketorolac  60 mg IM now during the visit.  Patient was referred to Marcene Serve, MD for further workup and  management of left shoulder pain. Final Clinical Impressions(s) / UC Diagnoses   Final diagnoses:  Acute pain of left shoulder     Discharge Instructions      Acute left shoulder pain: X-rays appear negative for dislocation or fracture.  X-rays will be read by radiology and I will contact the patient once radiology has provided a review, if their review is different than my review.  Ketorolac  60 mg injection now.  Ketorolac  is not on formulary for the patient.  Celebrex 200 mg twice daily as needed for pain.  Take the Celebrex with food.  Patient has an appointment with Vinnie Greet on 07/18/2023 for further workup and management of her shoulder.   ED  Prescriptions     Medication Sig Dispense Auth. Provider   celecoxib (CELEBREX) 200 MG capsule Take 1 capsule (200 mg total) by mouth 2 (two) times daily as needed for up to 15 days. 30 capsule Guss Legacy, FNP      PDMP not reviewed this encounter.   Guss Legacy, FNP 07/10/23 1058

## 2023-07-10 NOTE — Progress Notes (Signed)
 Left shoulder film is negative.  Patient updated via VM message.  Continue sling and follow-up with Dr. Rolinda Climes, as planned.

## 2023-07-10 NOTE — ED Triage Notes (Signed)
 Patient presents with c/o left shoulder pain x 2 days. Patient states she is taking Ibuprofen  for the pain.

## 2023-07-10 NOTE — Discharge Instructions (Addendum)
 Acute left shoulder pain: X-rays appear negative for dislocation or fracture.  X-rays will be read by radiology and I will contact the patient once radiology has provided a review, if their review is different than my review.  Ketorolac  60 mg injection now.  Ketorolac  is not on formulary for the patient.  Celebrex 200 mg twice daily as needed for pain.  Take the Celebrex with food.  Patient has an appointment with Vinnie Greet on 07/18/2023 for further workup and management of her shoulder.
